# Patient Record
Sex: Female | Born: 1985 | Race: Black or African American | Hispanic: No | Smoking: Never smoker
Health system: Southern US, Community
[De-identification: ages and names within clinical notes are randomized; demographics above are authoritative.]

## PROBLEM LIST (undated history)

## (undated) ENCOUNTER — Inpatient Hospital Stay (HOSPITAL_COMMUNITY): Payer: Self-pay

## (undated) DIAGNOSIS — R51 Headache: Secondary | ICD-10-CM

## (undated) DIAGNOSIS — IMO0002 Reserved for concepts with insufficient information to code with codable children: Secondary | ICD-10-CM

## (undated) DIAGNOSIS — D649 Anemia, unspecified: Secondary | ICD-10-CM

## (undated) DIAGNOSIS — R87629 Unspecified abnormal cytological findings in specimens from vagina: Secondary | ICD-10-CM

## (undated) DIAGNOSIS — R87619 Unspecified abnormal cytological findings in specimens from cervix uteri: Secondary | ICD-10-CM

## (undated) HISTORY — PX: WISDOM TOOTH EXTRACTION: SHX21

## (undated) HISTORY — DX: Anemia, unspecified: D64.9

## (undated) HISTORY — DX: Unspecified abnormal cytological findings in specimens from vagina: R87.629

---

## 2009-02-03 LAB — CONVERTED CEMR LAB: Pap Smear: NORMAL

## 2009-05-31 ENCOUNTER — Ambulatory Visit: Payer: Self-pay | Admitting: Internal Medicine

## 2009-05-31 ENCOUNTER — Telehealth: Payer: Self-pay | Admitting: Internal Medicine

## 2009-05-31 DIAGNOSIS — N912 Amenorrhea, unspecified: Secondary | ICD-10-CM

## 2009-05-31 DIAGNOSIS — O0001 Abdominal pregnancy with intrauterine pregnancy: Secondary | ICD-10-CM | POA: Insufficient documentation

## 2009-05-31 DIAGNOSIS — N898 Other specified noninflammatory disorders of vagina: Secondary | ICD-10-CM | POA: Insufficient documentation

## 2009-05-31 DIAGNOSIS — K219 Gastro-esophageal reflux disease without esophagitis: Secondary | ICD-10-CM

## 2009-05-31 DIAGNOSIS — R519 Headache, unspecified: Secondary | ICD-10-CM | POA: Insufficient documentation

## 2009-05-31 DIAGNOSIS — R51 Headache: Secondary | ICD-10-CM | POA: Insufficient documentation

## 2009-05-31 LAB — CONVERTED CEMR LAB
AST: 19 units/L (ref 0–37)
Albumin: 3.4 g/dL — ABNORMAL LOW (ref 3.5–5.2)
Alkaline Phosphatase: 45 units/L (ref 39–117)
Basophils Absolute: 0 10*3/uL (ref 0.0–0.1)
Beta hcg, urine, semiquantitative: POSITIVE
Bilirubin, Direct: 0.1 mg/dL (ref 0.0–0.3)
CO2: 27 meq/L (ref 19–32)
Calcium: 9.2 mg/dL (ref 8.4–10.5)
Eosinophils Relative: 3.2 % (ref 0.0–5.0)
GFR calc non Af Amer: 95.64 mL/min (ref >60)
Glucose, Bld: 84 mg/dL (ref 70–99)
HDL: 74.3 mg/dL (ref >39.00)
Lymphs Abs: 1.6 10*3/uL (ref 0.7–4.0)
Monocytes Absolute: 0.8 10*3/uL (ref 0.1–1.0)
Monocytes Relative: 13.4 % — ABNORMAL HIGH (ref 3.0–12.0)
Neutrophils Relative %: 56.4 % (ref 43.0–77.0)
Nitrite: NEGATIVE
Platelets: 204 10*3/uL (ref 150.0–400.0)
Potassium: 4.3 meq/L (ref 3.5–5.1)
RDW: 12.8 % (ref 11.5–14.6)
Sodium: 138 meq/L (ref 135–145)
Specific Gravity, Urine: 1.02 (ref 1.000–1.030)
Total CHOL/HDL Ratio: 2
Total Protein, Urine: NEGATIVE mg/dL
Triglycerides: 81 mg/dL (ref 0.0–149.0)
Urine Glucose: NEGATIVE mg/dL
VLDL: 16.2 mg/dL (ref 0.0–40.0)
WBC: 6 10*3/uL (ref 4.5–10.5)
pH: 7 (ref 5.0–8.0)

## 2009-06-01 ENCOUNTER — Encounter: Payer: Self-pay | Admitting: Internal Medicine

## 2009-06-04 ENCOUNTER — Telehealth: Payer: Self-pay | Admitting: Internal Medicine

## 2009-10-16 ENCOUNTER — Inpatient Hospital Stay (HOSPITAL_COMMUNITY): Admission: AD | Admit: 2009-10-16 | Discharge: 2009-10-16 | Payer: Self-pay | Admitting: Obstetrics & Gynecology

## 2009-11-01 ENCOUNTER — Inpatient Hospital Stay (HOSPITAL_COMMUNITY): Admission: AD | Admit: 2009-11-01 | Discharge: 2009-11-03 | Payer: Self-pay | Admitting: Obstetrics and Gynecology

## 2009-11-01 ENCOUNTER — Encounter (INDEPENDENT_AMBULATORY_CARE_PROVIDER_SITE_OTHER): Payer: Self-pay | Admitting: Obstetrics and Gynecology

## 2009-12-17 ENCOUNTER — Ambulatory Visit: Payer: Self-pay | Admitting: Physician Assistant

## 2009-12-17 ENCOUNTER — Inpatient Hospital Stay (HOSPITAL_COMMUNITY): Admission: AD | Admit: 2009-12-17 | Discharge: 2009-12-18 | Payer: Self-pay | Admitting: Obstetrics and Gynecology

## 2010-05-31 NOTE — Progress Notes (Signed)
Summary: pharmacy  Phone Note Outgoing Call   Summary of Call: LA:  Her wet prep was positive, please find out where she wants the antibiotic Rx sent to Initial call taken by: Etta Grandchild MD,  May 31, 2009 12:24 PM  Follow-up for Phone Call        called patient to confirm pharmacy, will [facility] Follow-up by: Rock Nephew CMA,  May 31, 2009 1:26 PM    Prescriptions: METRONIDAZOLE 500 MG TABS (METRONIDAZOLE) One by mouth two times a day for 7 days  #14 x 0   Entered by:   Rock Nephew CMA   Authorized by:   Etta Grandchild MD   Signed by:   Rock Nephew CMA on 05/31/2009   Method used:   [facility]ronically to        [facility] [phone]* (retail)       [address]       [place], [place]  [address]       Ph: [phone] or [phone]       Fax: [phone]   RxID:   [id number]

## 2010-05-31 NOTE — Progress Notes (Signed)
Summary: RX  Phone Note From Pharmacy   Caller: [facility] [phone]* Summary of Call: [facility] that [facility] cannot give [facility] the metronidazole b/c is it out of stock. Per the pharmacy they cannot get it until next month. Called patient to see if she wanted [place] to try a different pharmacy, left message on voicemail to call the office.  Initial call taken by: Lucious Groves,  June 04, 2009 3:16 PM     Appended Document: RX    Phone Note Call from Patient   Summary of Call: Patient returned call stating that metronidazole is on back order system wide for the next month. [facility] to find out if brand name flagyl is available. Per [facility], ok to switch to alternative clyndamycin. Patient and pharmacist notified Initial call taken by: Rock Nephew CMA,  June 04, 2009 5:15 PM    New/Updated Medications: CLINDAMYCIN PHOSPHATE 2 % CREA (CLINDAMYCIN PHOSPHATE) apply one vaginal application at bedtime x 7days Prescriptions: CLINDAMYCIN PHOSPHATE 2 % CREA (CLINDAMYCIN PHOSPHATE) apply one vaginal application at bedtime x 7days  #48ml x 0   Entered by:   Rock Nephew CMA   Authorized by:   Jacques Navy MD   Signed by:   Rock Nephew CMA on 06/04/2009   Method used:   Telephoned to ...       [facility] [place] [phone]* (retail)       [phone]       [place], [place]  [address]       Ph: [phone] or [phone]       Fax: [phone]   RxID:   [id number]

## 2010-05-31 NOTE — Progress Notes (Signed)
     New Problems: ABDOMINAL PREGNANCY WITH INTRAUTERINE PREGNANCY (ICD-633.01)   New Problems: ABDOMINAL PREGNANCY WITH INTRAUTERINE PREGNANCY (ICD-633.01)

## 2010-05-31 NOTE — Assessment & Plan Note (Signed)
Summary: NEW / [facility] / #/ CD   Vital Signs:  Patient profile:   25 year old female Menstrual status:  regular LMP:     02/03/2009 Height:      68 inches Weight:      136 pounds BMI:     20.75 O2 Sat:      100 % on Room air Temp:     98.1 degrees F oral Pulse rate:   77 / minute Pulse rhythm:   regular Resp:     16 per minute BP sitting:   104 / 68  (left arm) Cuff size:   regular  Vitals Entered By: Rock Nephew CMA (May 31, 2009 10:57 AM)  O2 Flow:  Room air  Primary Care Provider:  Etta Grandchild MD   History of Present Illness: New to me she c/o's a 3 month hx. of amenorrhea and nausea but says she uses condoms all the time.  Preventive Screening-Counseling & Management  Alcohol-Tobacco     Alcohol drinks/day: <1     Alcohol type: spirits     >5/day in last 3 mos: no     Alcohol Counseling: not indicated; use of alcohol is not excessive or problematic     Feels need to cut down: no     Feels annoyed by complaints: no     Feels guilty re: drinking: no     Needs 'eye opener' in am: no     Smoking Status: never  Caffeine-Diet-Exercise     Does Patient Exercise: yes  Hep-HIV-STD-Contraception     Hepatitis Risk: no risk noted     HIV Risk: no risk noted     STD Risk: risk noted     STD Risk Counseling: to avoid increased STD risk     Contraception Counseling: questions answered      Sexual History:  currently monogamous.        Drug Use:  no.        Blood Transfusions:  no.    Medications Prior to Update: 1)  None  Current Medications (verified): 1)  None  Allergies (verified): No Known Drug Allergies  Past History:  Past Medical History: GERD Headache, migraine since 21 yoa  Past Surgical History: Denies surgical history  Family History: Family History Hypertension Family History Kidney disease  Social History: Single Never Smoked Alcohol use-yes Drug use-no Regular exercise-yes Smoking Status:  never Hepatitis Risk:   no risk noted HIV Risk:  no risk noted STD Risk:  risk noted Sexual History:  currently monogamous Blood Transfusions:  no Drug Use:  no Does Patient Exercise:  yes  Review of Systems  The patient denies anorexia, fever, weight loss, weight gain, chest pain, peripheral edema, prolonged cough, headaches, hemoptysis, abdominal pain, hematuria, and breast masses.   GU:  Complains of discharge; denies abnormal vaginal bleeding, dysuria, hematuria, incontinence, nocturia, urinary frequency, and urinary hesitancy.  Physical Exam  General:  alert, well-developed, well-nourished, well-hydrated, appropriate dress, normal appearance, healthy-appearing, cooperative to examination, good hygiene, and underweight appearing.   Head:  normocephalic, atraumatic, no abnormalities observed, and no abnormalities palpated.   Eyes:  vision grossly intact, pupils equal, pupils round, and pupils reactive to light.   Ears:  R ear normal and L ear normal.   Mouth:  Oral mucosa and oropharynx without lesions or exudates.  Teeth in good repair. Neck:  No deformities, masses, or tenderness noted. Chest Wall:  No deformities, masses, or tenderness noted. Breasts:  No mass,  nodules, thickening, tenderness, bulging, retraction, inflamation, nipple discharge or skin changes noted.   Lungs:  Normal respiratory effort, chest expands symmetrically. Lungs are clear to auscultation, no crackles or wheezes. Heart:  Normal rate and regular rhythm. S1 and S2 normal without gallop, murmur, click, rub or other extra sounds. Abdomen:  soft, non-tender, normal bowel sounds, no distention, no masses, no guarding, no rigidity, no rebound tenderness, and no hepatomegaly.   Genitalia:  uterus is enlarged and palpable c/w 1st trimester, there is a scant amount of curdled exudate, normal introitus, no external lesions, mucosa pink and moist, no vaginal or cervical lesions, no friaility or hemorrhage, and no adnexal masses or tenderness.     Msk:  normal ROM, no joint tenderness, no joint swelling, no joint warmth, no redness over joints, no joint deformities, no joint instability, and no crepitation.   Pulses:  R and L carotid,radial,femoral,dorsalis pedis and posterior tibial pulses are full and equal bilaterally Extremities:  No clubbing, cyanosis, edema, or deformity noted with normal full range of motion of all joints.   Neurologic:  No cranial nerve deficits noted. Station and gait are normal. Plantar reflexes are down-going bilaterally. DTRs are symmetrical throughout. Sensory, motor and coordinative functions appear intact. Skin:  Intact without suspicious lesions or rashes Cervical Nodes:  no anterior cervical adenopathy and no posterior cervical adenopathy.   Axillary Nodes:  no R axillary adenopathy and no L axillary adenopathy.   Inguinal Nodes:  no R inguinal adenopathy and no L inguinal adenopathy.   Psych:  Cognition and judgment appear intact. Alert and cooperative with normal attention span and concentration. No apparent delusions, illusions, hallucinations   Impression & Recommendations:  Problem # 1:  AMENORRHEA (ICD-626.0) Assessment New  Orders: Venipuncture (09323) TLB-Lipid Panel (80061-LIPID) TLB-CBC Platelet - w/Differential (85025-CBCD) TLB-BMP (Basic Metabolic Panel-BMET) (80048-METABOL) TLB-Hepatic/Liver Function Pnl (80076-HEPATIC) TLB-TSH (Thyroid Stimulating Hormone) (84443-TSH) TLB-Preg Serum Quant (B-hCG) (84702-HCG-QN) TLB-Udip w/ Micro (81001-URINE) T-Chlamydia Probe, genital (55732-20254) T-GC Probe, genital (27062-37628) Urine Pregnancy Test  (31517)  Problem # 2:  VAGINAL DISCHARGE (ICD-623.5) Assessment: New wet prep positive for clue cells so will start metronidazole Orders: Urine Pregnancy Test  (61607) TLB-Wet [place] / Fungus (87210-WPREP)  Problem # 3:  ROUTINE GENERAL MEDICAL EXAM@HEALTH  CARE FACL (ICD-V70.0) Assessment: New  Orders: Venipuncture (37106) TLB-Lipid  Panel (80061-LIPID) TLB-CBC Platelet - w/Differential (85025-CBCD) TLB-BMP (Basic Metabolic Panel-BMET) (80048-METABOL) TLB-Hepatic/Liver Function Pnl (80076-HEPATIC) TLB-TSH (Thyroid Stimulating Hormone) (84443-TSH) TLB-Preg Serum Quant (B-hCG) (84702-HCG-QN) TLB-Udip w/ Micro (81001-URINE) T-Chlamydia Probe, genital (26948-54627) T-GC Probe, genital (03500-93818) Urine Pregnancy Test  (29937)  Complete Medication List: 1)  Metronidazole 500 Mg Tabs (Metronidazole) .... One by mouth two times a day for 7 days  PAP Screening:    Last PAP smear:  02/03/2009  Osteoporosis Risk Assessment:  Risk Factors for Fracture or Low Bone Density:   Smoking status:       never  Patient Instructions: 1)  Please schedule a follow-up appointment in 1 month. 2)  If you could be exposed to sexually transmitted diseases, you should use a condom. Prescriptions: METRONIDAZOLE 500 MG TABS (METRONIDAZOLE) One by mouth two times a day for 7 days  #14 x 0   Entered and Authorized by:   Etta Grandchild MD   Signed by:   Etta Grandchild MD on 05/31/2009   Method used:   Historical   RxID:   [id number]   Preventive Care Screening  Pap Smear:    Date:  02/03/2009    Results:  normal      Laboratory Results   Urine Tests      Urine HCG: positive

## 2010-05-31 NOTE — Letter (Signed)
Summary: Results Follow-up Letter  [facility]  [address] [place], [place] [address]   Phone: [phone]  Fax: [phone]    06/01/2009  [address] [place], [place]  [address]  Dear Ms. Bosak,   The following are the results of your recent test(s):  Test     Result     CBC       mild anemia Urine       trace evidence of infection Liver/kidney   normal Preg. test     very high Vaginal specimen   positive for bacteria  _________________________________________________________  Please call for an appointment in 2-3 weeks _________________________________________________________ _________________________________________________________ _________________________________________________________  Sincerely,  Sanda Linger MD [facility]

## 2010-07-17 LAB — CBC
HCT: 35.5 % — ABNORMAL LOW (ref 36.0–46.0)
Hemoglobin: 10.9 g/dL — ABNORMAL LOW (ref 12.0–15.0)
MCH: 27.7 pg (ref 26.0–34.0)
MCH: 28.4 pg (ref 26.0–34.0)
MCHC: 33.3 g/dL (ref 30.0–36.0)
MCV: 83.3 fL (ref 78.0–100.0)
MCV: 84.9 fL (ref 78.0–100.0)
Platelets: 237 10*3/uL (ref 150–400)
RBC: 3.85 MIL/uL — ABNORMAL LOW (ref 3.87–5.11)
RDW: 18.8 % — ABNORMAL HIGH (ref 11.5–15.5)
WBC: 7.9 10*3/uL (ref 4.0–10.5)

## 2011-05-01 ENCOUNTER — Emergency Department (HOSPITAL_COMMUNITY)
Admission: EM | Admit: 2011-05-01 | Discharge: 2011-05-01 | Disposition: A | Discharge status: Home / Self Care | Payer: Medicaid Other | Attending: Emergency Medicine | Admitting: Emergency Medicine

## 2011-05-01 ENCOUNTER — Encounter: Payer: Self-pay | Admitting: *Deleted

## 2011-05-01 DIAGNOSIS — R070 Pain in throat: Secondary | ICD-10-CM | POA: Insufficient documentation

## 2011-05-01 DIAGNOSIS — B349 Viral infection, unspecified: Secondary | ICD-10-CM

## 2011-05-01 DIAGNOSIS — R05 Cough: Secondary | ICD-10-CM | POA: Insufficient documentation

## 2011-05-01 DIAGNOSIS — J069 Acute upper respiratory infection, unspecified: Secondary | ICD-10-CM | POA: Insufficient documentation

## 2011-05-01 DIAGNOSIS — R059 Cough, unspecified: Secondary | ICD-10-CM | POA: Insufficient documentation

## 2011-05-01 DIAGNOSIS — H9209 Otalgia, unspecified ear: Secondary | ICD-10-CM | POA: Insufficient documentation

## 2011-05-01 DIAGNOSIS — B9789 Other viral agents as the cause of diseases classified elsewhere: Secondary | ICD-10-CM | POA: Insufficient documentation

## 2011-05-01 DIAGNOSIS — J3489 Other specified disorders of nose and nasal sinuses: Secondary | ICD-10-CM | POA: Insufficient documentation

## 2011-05-01 LAB — RAPID STREP SCREEN (MED CTR MEBANE ONLY): Streptococcus, Group A Screen (Direct): NEGATIVE

## 2011-05-01 MED ORDER — IBUPROFEN 800 MG PO TABS
800.0000 mg | ORAL_TABLET | Freq: Once | ORAL | Status: AC
  Administered 2011-05-01: 800 mg via ORAL
  Filled 2011-05-01: qty 1

## 2011-05-01 NOTE — ED Notes (Signed)
Pt c/o bilateral otalgia, and tonsillar swelling.  Pt also experiencing coughing.  Pt denies N/V/D or fever.

## 2011-05-01 NOTE — ED Provider Notes (Signed)
History     CSN: [id number]  Arrival date & time 05/01/11  3086   First MD Initiated Contact with Patient 05/01/11 [phone]      Chief Complaint  Patient presents with  . Otalgia    HPI  History provided by the patient. Patient is 25 year old female with no significant past medical history whose come to the emergency room to have her 69-year-old son evaluated for cough cold and fever who now also wants to be checked for complaints of sore throat and bilateral ear pain for the past one to 2 days. He reports symptoms came on acutely and have been gradually increasing. She also reports developing a slight cough at times. She denies having any nausea vomiting or diarrhea. She denies feeling fever, chills, sweats. She has not taken anything for her symptoms.    History reviewed. No pertinent past medical history.  History reviewed. No pertinent past surgical history.  History reviewed. No pertinent family history.  History  Substance Use Topics  . Smoking status: Not on file  . Smokeless tobacco: Not on file  . Alcohol Use: Not on file    OB History    Grav Para Term Preterm Abortions TAB SAB Ect Mult Living                  Review of Systems  Constitutional: Negative for fever and chills.  HENT: Positive for ear pain, sore throat and rhinorrhea. Negative for hearing loss and congestion.   Respiratory: Positive for cough. Negative for shortness of breath.   Cardiovascular: Negative for chest pain.  Gastrointestinal: Negative for nausea, vomiting and diarrhea.  All other systems reviewed and are negative.    Allergies  Review of patient's allergies indicates no known allergies.  Home Medications  No current outpatient prescriptions on file.  BP 129/70  Pulse 75  Temp(Src) 97.8 F (36.6 C) (Oral)  Resp 20  SpO2 100%  LMP 05/01/2011  Physical Exam  Nursing note and vitals reviewed. Constitutional: She is oriented to person, place, and time. She appears  well-developed and well-nourished. No distress.  HENT:  Head: Normocephalic.       Erythema to bilateral TMs. Mild erythema of oropharynx. Tonsils normal size without exudate.  Eyes: Conjunctivae and EOM are normal. Pupils are equal, round, and reactive to light.  Neck: Normal range of motion. Neck supple.  Cardiovascular: Normal rate and regular rhythm.   Pulmonary/Chest: Effort normal and breath sounds normal. No respiratory distress. She has no wheezes. She has no rales.  Abdominal: Soft. Bowel sounds are normal. She exhibits no distension. There is no tenderness.  Lymphadenopathy:    She has no cervical adenopathy.  Neurological: She is alert and oriented to person, place, and time.  Skin: Skin is warm and dry. No rash noted.  Psychiatric: She has a normal mood and affect. Her behavior is normal.    ED Course  Procedures (including critical care time)   Labs Reviewed  RAPID STREP SCREEN   Results for orders placed during the hospital encounter of 05/01/11  RAPID STREP SCREEN      Component Value Range   Streptococcus, Group A Screen (Direct) NEGATIVE  NEGATIVE      1. Viral infection   2. URI (upper respiratory infection)   3. Otalgia       MDM  5:00AM she seen and evaluated. Patient in no acute distress.        Phill Mutter [place], [place] 05/01/11 [phone]  Medical screening examination/treatment/procedure(s)  were performed by non-physician practitioner and as supervising physician I was immediately available for consultation/collaboration.   Sunnie Nielsen, MD 05/01/11 [phone]

## 2011-05-01 NOTE — ED Notes (Signed)
Pt presents with throat and ear pain x 3 days.

## 2012-02-18 ENCOUNTER — Encounter (HOSPITAL_COMMUNITY): Payer: Self-pay | Admitting: *Deleted

## 2012-02-18 ENCOUNTER — Inpatient Hospital Stay (HOSPITAL_COMMUNITY): Payer: Medicaid Other

## 2012-02-18 ENCOUNTER — Inpatient Hospital Stay (HOSPITAL_COMMUNITY)
Admission: AD | Admit: 2012-02-18 | Discharge: 2012-02-18 | Disposition: A | Discharge status: Home / Self Care | Payer: Medicaid Other | Source: Ambulatory Visit | Attending: Family Medicine | Admitting: Family Medicine

## 2012-02-18 DIAGNOSIS — O200 Threatened abortion: Secondary | ICD-10-CM | POA: Insufficient documentation

## 2012-02-18 DIAGNOSIS — O4690 Antepartum hemorrhage, unspecified, unspecified trimester: Secondary | ICD-10-CM

## 2012-02-18 HISTORY — DX: Headache: R51

## 2012-02-18 HISTORY — DX: Unspecified abnormal cytological findings in specimens from cervix uteri: R87.619

## 2012-02-18 HISTORY — DX: Reserved for concepts with insufficient information to code with codable children: IMO0002

## 2012-02-18 LAB — CBC
MCHC: 32.2 g/dL (ref 30.0–36.0)
MCV: 83.7 fL (ref 78.0–100.0)
Platelets: 268 10*3/uL (ref 150–400)
RDW: 13.1 % (ref 11.5–15.5)
WBC: 4.8 10*3/uL (ref 4.0–10.5)

## 2012-02-18 LAB — WET PREP, GENITAL
Trich, Wet Prep: NONE SEEN
Yeast Wet Prep HPF POC: NONE SEEN

## 2012-02-18 LAB — POCT PREGNANCY, URINE: Preg Test, Ur: POSITIVE — AB

## 2012-02-18 NOTE — MAU Note (Signed)
Period is Sept was short and light.  Pos HPT today and 3 days ago.

## 2012-02-18 NOTE — MAU Provider Note (Signed)
  History     CSN: [id number]  Arrival date and time: 02/18/12 1340   First Provider Initiated Contact with Patient 02/18/12 1422      Chief Complaint  Patient presents with  . Threatened Miscarriage   HPI Amber Newton 26 y.o. Comes to MAU with vaginal bleeding and cramping.  Had positive home pregnancy test. LMP 01-10-12.   Has not had any prenatal care.  Gives verbal history of heavier bleeding and cramping.  Then she passed some clots and the cramping has stopped.  Bleeding is much smaller amount now but is continuing.  Is worried about a miscarriage.  OB History    Grav Para Term Preterm Abortions TAB SAB Ect Mult Living   1 1 1  0 0 0 0 0 0 1      Past Medical History  Diagnosis Date  . Abnormal Pap smear     HPV, colpo  . Infection     UTI  . Headache     Past Surgical History  Procedure Date  . No past surgeries     Family History  Problem Relation Age of Onset  . Family history unknown: Yes    History  Substance Use Topics  . Smoking status: Former Games developer  . Smokeless tobacco: Never Used  . Alcohol Use: No    Allergies: No Known Allergies  Prescriptions prior to admission  Medication Sig Dispense Refill  . ibuprofen (ADVIL,MOTRIN) 200 MG tablet Take 200 mg by mouth every 6 (six) hours as needed. pain        ROS Physical Exam   Blood pressure 125/80, pulse 70, temperature 98.6 F (37 C), temperature source Oral, resp. rate 18, last menstrual period 01/10/2012, SpO2 99.00%.  Physical Exam  Nursing note and vitals reviewed. Constitutional: She is oriented to person, place, and time. She appears well-developed and well-nourished.  HENT:  Head: Normocephalic.  Eyes: EOM are normal.  Neck: Neck supple.  GI: Soft. There is no tenderness.  Genitourinary:       Speculum exam - Moderate dark vaginal bleeding with clotting.  Musculoskeletal: Normal range of motion.  Neurological: She is alert and oriented to person, place, and time.  Skin: Skin is  warm and dry.  Psychiatric: She has a normal mood and affect.    MAU Course  Procedures  MDM Clinical Data: Vaginal bleeding  OBSTETRIC <14 WK ULTRASOUND, TRANSVAGINAL OB US, AND FETAL NUCHAL  TRANSLUCENCY [place]  Technique: Transabdominal and transvaginal ultrasound was  performed for evaluation of the gestation as well as the maternal  uterus and adnexal regions. Fetal nuchal translucency measurement  was also obtained.  Findings:  No intrauterine gestational sac.  Maternal uterus/adnexae: Within the midportion of the endometrium  there is a focal area of thickening measuring 1.25 cm. Mobile blood  is identified within the endometrial cavity and there are areas of  increased vascularity.  Both ovaries appear normal.  No free fluid within the pelvis.  IMPRESSION:  1. No evidence for intrauterine pregnancy.  2. Endometrial thickening and mobile blood within the endometrial  cavity may reflect sequela of failed pregnancy.   Blood Type A positive  Assessment and Plan  Bleeding in pregnancy  Plan Discussed at length ectopic pregnancy vs miscarriage.  Ectopic precautions reviewed. Will repeat quant on Wed am to verify miscarriage. Advised to return sooner is having worsening pain.  BURLESON,TERRI 02/18/2012, 2:30 PM

## 2012-02-18 NOTE — MAU Note (Signed)
Bleeding started 1230, was cramping really bad, blood when wiped, blood in toilet, passed a clot.

## 2012-02-19 LAB — GC/CHLAMYDIA PROBE AMP, GENITAL: Chlamydia, DNA Probe: NEGATIVE

## 2012-02-19 NOTE — MAU Provider Note (Signed)
Chart reviewed and agree with management and plan.  

## 2012-02-21 ENCOUNTER — Inpatient Hospital Stay (HOSPITAL_COMMUNITY)
Admission: AD | Admit: 2012-02-21 | Discharge: 2012-02-21 | Disposition: A | Discharge status: Home / Self Care | Payer: Medicaid Other | Source: Ambulatory Visit | Attending: Obstetrics & Gynecology | Admitting: Obstetrics & Gynecology

## 2012-02-21 DIAGNOSIS — O039 Complete or unspecified spontaneous abortion without complication: Secondary | ICD-10-CM | POA: Insufficient documentation

## 2012-02-21 NOTE — MAU Note (Signed)
Pt here for F/U BHCG, continues to have abd & back pain, also bleeding.

## 2012-02-21 NOTE — MAU Provider Note (Signed)
  History     CSN: [id number]  Arrival date and time: 02/21/12 1120   None     Chief Complaint  Patient presents with  . Follow-up   HPI THis is a 26 y.o. female at [redacted]w[redacted]d who presents for followup Quant HCG.  She was seen 3 days ago following an episode of heavy bleeding with passage of a large clump of tissue at home. She returns today to verify progression of SAB.  OB History    Grav Para Term Preterm Abortions TAB SAB Ect Mult Living   2 1 1  0 0 0 0 0 0 1      Past Medical History  Diagnosis Date  . Abnormal Pap smear     HPV, colpo  . Infection     UTI  . Headache     Past Surgical History  Procedure Date  . No past surgeries     Family History  Problem Relation Age of Onset  . Family history unknown: Yes    History  Substance Use Topics  . Smoking status: Former Games developer  . Smokeless tobacco: Never Used  . Alcohol Use: No    Allergies: No Known Allergies  Prescriptions prior to admission  Medication Sig Dispense Refill  . ibuprofen (ADVIL,MOTRIN) 200 MG tablet Take 200 mg by mouth every 6 (six) hours as needed. pain        ROS See HPI Denies significant pain, does have small amt bleeding  Physical Exam   Blood pressure 112/67, pulse 64, temperature 98 F (36.7 C), temperature source Oral, resp. rate 18, last menstrual period 01/10/2012.  Physical Exam  Constitutional: She is oriented to person, place, and time. She appears well-developed and well-nourished. No distress.  Cardiovascular: Normal rate.   Respiratory: Effort normal.  Musculoskeletal: Normal range of motion.  Neurological: She is alert and oriented to person, place, and time.  Skin: Skin is warm and dry.  Psychiatric: She has a normal mood and affect.   Pelvic exam not indicated  MAU Course  Procedures  MDM Results for orders placed during the hospital encounter of 02/21/12 (from the past 24 hour(s))  HCG, QUANTITATIVE, PREGNANCY     Status: Abnormal   Collection Time   02/21/12 11:50 AM      Component Value Range   hCG, Beta Chain, Quant, S 257 (*) <5 mIU/mL   ** Previous Quant HCG was 2441 on 02/18/12  Assessment and Plan  A:  Pregnancy, probably complete SAB      Significant fall in Quantitative HCG level  P:  Discussed with patient      Will follow weekly quants in Clinic      Will schedule f/u visit in Clinic in 4 wks.  Does not want to get pregnant again, will discuss contraception then. Pelvic rest while bleeding.   Wynelle Bourgeois 02/21/2012, 1:17 PM

## 2012-02-21 NOTE — MAU Note (Signed)
MWilliams, CNM notified of pt's labs back, will review

## 2012-03-01 ENCOUNTER — Other Ambulatory Visit: Payer: Medicaid Other

## 2012-03-01 NOTE — MAU Provider Note (Signed)
Medical Screening exam and patient care preformed by advanced practice provider.  Agree with the above management.  

## 2012-03-22 ENCOUNTER — Ambulatory Visit: Payer: Medicaid Other | Admitting: Advanced Practice Midwife

## 2012-03-25 ENCOUNTER — Ambulatory Visit: Payer: Medicaid Other | Admitting: Obstetrics and Gynecology

## 2012-12-23 ENCOUNTER — Encounter (HOSPITAL_COMMUNITY): Payer: Self-pay | Admitting: *Deleted

## 2013-01-31 ENCOUNTER — Encounter (HOSPITAL_COMMUNITY): Payer: Self-pay | Admitting: *Deleted

## 2013-01-31 ENCOUNTER — Inpatient Hospital Stay (HOSPITAL_COMMUNITY)
Admission: AD | Admit: 2013-01-31 | Discharge: 2013-01-31 | Disposition: A | Discharge status: Home / Self Care | Payer: Medicaid Other | Source: Ambulatory Visit | Attending: Obstetrics and Gynecology | Admitting: Obstetrics and Gynecology

## 2013-01-31 ENCOUNTER — Inpatient Hospital Stay (HOSPITAL_COMMUNITY): Payer: Medicaid Other

## 2013-01-31 DIAGNOSIS — O26899 Other specified pregnancy related conditions, unspecified trimester: Secondary | ICD-10-CM

## 2013-01-31 DIAGNOSIS — R350 Frequency of micturition: Secondary | ICD-10-CM | POA: Insufficient documentation

## 2013-01-31 DIAGNOSIS — O9989 Other specified diseases and conditions complicating pregnancy, childbirth and the puerperium: Secondary | ICD-10-CM

## 2013-01-31 DIAGNOSIS — O99891 Other specified diseases and conditions complicating pregnancy: Secondary | ICD-10-CM | POA: Insufficient documentation

## 2013-01-31 DIAGNOSIS — R109 Unspecified abdominal pain: Secondary | ICD-10-CM

## 2013-01-31 LAB — WET PREP, GENITAL: Yeast Wet Prep HPF POC: NONE SEEN

## 2013-01-31 LAB — CBC
HCT: 36.5 % (ref 36.0–46.0)
MCH: 27.4 pg (ref 26.0–34.0)
MCV: 82.6 fL (ref 78.0–100.0)
Platelets: 285 10*3/uL (ref 150–400)
RBC: 4.42 MIL/uL (ref 3.87–5.11)
RDW: 13.1 % (ref 11.5–15.5)
WBC: 6.4 10*3/uL (ref 4.0–10.5)

## 2013-01-31 LAB — URINALYSIS, ROUTINE W REFLEX MICROSCOPIC
Bilirubin Urine: NEGATIVE
Glucose, UA: NEGATIVE mg/dL
Hgb urine dipstick: NEGATIVE
Specific Gravity, Urine: 1.015 (ref 1.005–1.030)
Urobilinogen, UA: 1 mg/dL (ref 0.0–1.0)

## 2013-01-31 LAB — POCT PREGNANCY, URINE: Preg Test, Ur: POSITIVE — AB

## 2013-01-31 MED ORDER — PHENAZOPYRIDINE HCL 100 MG PO TABS
200.0000 mg | ORAL_TABLET | Freq: Once | ORAL | Status: AC
  Administered 2013-01-31: 200 mg via ORAL
  Filled 2013-01-31 (×2): qty 1

## 2013-01-31 MED ORDER — PHENAZOPYRIDINE HCL 200 MG PO TABS: 200.0000 mg | ORAL_TABLET | Freq: Three times a day (TID) | ORAL | Status: DC

## 2013-01-31 NOTE — MAU Note (Signed)
Patient presents with complaint of lower abdominal cramping x 2 days. States she has a UTI and is taking macrobid.

## 2013-01-31 NOTE — MAU Provider Note (Signed)
Chief Complaint: Abdominal Pain   First Provider Initiated Contact with Patient 01/31/13 1507     SUBJECTIVE HPI: Amber Newton is a 27 y.o. G3P1011 at 4.3 by LMP who presents with low abd cramping that feels similar when she miscarried last year. Pos home UPT. No other testing this pregnancy. Called office two days ago because of urinary frequency and dysuria. Called in Rx Macrobid. Has taken 2 doses. No relief of Sx.   Denies fever, chills, flank pain, VB, GI complaints. Last BM today. Has had increase white discharge w/out odor or itching.   Past Medical History  Diagnosis Date  . Abnormal Pap smear     HPV, colpo  . Infection     UTI  . Headache(784.0)    OB History  Gravida Para Term Preterm AB SAB TAB Ectopic Multiple Living  4 1 1  0 1 1 0 0 0 1    # Outcome Date GA Lbr Len/2nd Weight Sex Delivery Anes PTL Lv  4 CUR           3 SAB           2 TRM           1 GRA              Comments: System Generated. Please review and update pregnancy details.     Past Surgical History  Procedure Laterality Date  . No past surgeries     History   Social History  . Marital Status: Single    Spouse Name: N/A    Number of Children: N/A  . Years of Education: N/A   Occupational History  . Not on file.   Social History Main Topics  . Smoking status: Former Games developer  . Smokeless tobacco: Never Used  . Alcohol Use: No  . Drug Use: No  . Sexual Activity: Yes   Other Topics Concern  . Not on file   Social History Narrative  . No narrative on file   No current facility-administered medications on file prior to encounter.   No current outpatient prescriptions on file prior to encounter.   No Known Allergies  ROS: Pertinent items in HPI.  OBJECTIVE Blood pressure 113/47, pulse 89, temperature 98.7 F (37.1 C), temperature source Oral, resp. rate 18, height 5\' 8"  (1.727 m), weight 74.844 kg (165 lb), last menstrual period 12/31/2012, not currently breastfeeding. GENERAL:  Well-developed, well-nourished female in no acute distress.  HEENT: Normocephalic HEART: normal rate RESP: normal effort ABDOMEN: Soft, mile SP tenderness. Neg rebound, mass or guarding. Pos BS.  EXTREMITIES: Nontender, no edema NEURO: Alert and oriented SPECULUM EXAM: NEFG, large amount of thick, white, odorless discharge, no blood noted, cervix clean BIMANUAL: cervix closed; uterus normal size, no adnexal tenderness or masses. No CMT.   LAB RESULTS Results for orders placed during the hospital encounter of 01/31/13 (from the past 24 hour(s))  URINALYSIS, ROUTINE W REFLEX MICROSCOPIC     Status: Abnormal   Collection Time    01/31/13  2:30 PM      Result Value Range   Color, Urine YELLOW  YELLOW   APPearance CLEAR  CLEAR   Specific Gravity, Urine 1.015  1.005 - 1.030   pH 8.5 (*) 5.0 - 8.0   Glucose, UA NEGATIVE  NEGATIVE mg/dL   Hgb urine dipstick NEGATIVE  NEGATIVE   Bilirubin Urine NEGATIVE  NEGATIVE   Ketones, ur NEGATIVE  NEGATIVE mg/dL   Protein, ur NEGATIVE  NEGATIVE mg/dL  Urobilinogen, UA 1.0  0.0 - 1.0 mg/dL   Nitrite NEGATIVE  NEGATIVE   Leukocytes, UA NEGATIVE  NEGATIVE  POCT PREGNANCY, URINE     Status: Abnormal   Collection Time    01/31/13  3:04 PM      Result Value Range   Preg Test, Ur POSITIVE (*) NEGATIVE  HCG, QUANTITATIVE, PREGNANCY     Status: Abnormal   Collection Time    01/31/13  3:25 PM      Result Value Range   hCG, Beta Chain, Quant, S 1897 (*) <5 mIU/mL  CBC     Status: None   Collection Time    01/31/13  3:25 PM      Result Value Range   WBC 6.4  4.0 - 10.5 K/uL   RBC 4.42  3.87 - 5.11 MIL/uL   Hemoglobin 12.1  12.0 - 15.0 g/dL   HCT 21.3  08.6 - 57.8 %   MCV 82.6  78.0 - 100.0 fL   MCH 27.4  26.0 - 34.0 pg   MCHC 33.2  30.0 - 36.0 g/dL   RDW 46.9  62.9 - 52.8 %   Platelets 285  150 - 400 K/uL    IMAGING No results found.  MAU COURSE  ASSESSMENT 1. Abdominal pain complicating pregnancy, antepartum   2. Urinary frequency      PLAN Discharge home per consult w/ [place].  Ectopic and SAB precautions.      Follow-up Information   Follow up with [facility] [place] [facility] In 2 days. (or sooner  as needed if symptoms worsen)    Contact information:   [address] [id number] [place] [address] [phone]       Medication List         nitrofurantoin (macrocrystal-monohydrate) 100 MG capsule  Commonly known as:  MACROBID  Take 100 mg by mouth 2 (two) times daily.     phenazopyridine 200 MG tablet  Commonly known as:  PYRIDIUM  Take 1 tablet (200 mg total) by mouth 3 (three) times daily.        Byers, CNM 01/31/2013  6:11 PM

## 2013-01-31 NOTE — MAU Note (Signed)
Pt states here for lower abdominal pain. Feels like menstrual cycle is coming on, however she is pregnant. Prior miscarriage before, concerned the pregnancy is okay. Denies abnormal discharge or bleeding. Taking macrobid for uti.

## 2013-02-01 LAB — CULTURE, OB URINE
Colony Count: 55000
Special Requests: NORMAL

## 2013-02-02 ENCOUNTER — Inpatient Hospital Stay (HOSPITAL_COMMUNITY)
Admission: AD | Admit: 2013-02-02 | Discharge: 2013-02-02 | Disposition: A | Discharge status: Home / Self Care | Payer: Medicaid Other | Source: Ambulatory Visit | Attending: Obstetrics and Gynecology | Admitting: Obstetrics and Gynecology

## 2013-02-02 DIAGNOSIS — N390 Urinary tract infection, site not specified: Secondary | ICD-10-CM | POA: Insufficient documentation

## 2013-02-02 DIAGNOSIS — O2341 Unspecified infection of urinary tract in pregnancy, first trimester: Secondary | ICD-10-CM

## 2013-02-02 DIAGNOSIS — O2390 Unspecified genitourinary tract infection in pregnancy, unspecified trimester: Secondary | ICD-10-CM | POA: Insufficient documentation

## 2013-02-02 NOTE — MAU Provider Note (Signed)
Amber Newton is a 27 y.o. G4P1011 at [redacted]w[redacted]d here for follow up quant HCG. Vaginal bleeding: none. Abdominal pain: none.   Prior HCGs: 10/3: 1800  Prior U/S: 10/3: 4 week IUGS, no YS or fetal pole  Past Medical History  Diagnosis Date  . Abnormal Pap smear     HPV, colpo  . Infection     UTI  . Headache(784.0)     Prescriptions prior to admission  Medication Sig Dispense Refill  . nitrofurantoin, macrocrystal-monohydrate, (MACROBID) 100 MG capsule Take 100 mg by mouth 2 (two) times daily.      . phenazopyridine (PYRIDIUM) 200 MG tablet Take 1 tablet (200 mg total) by mouth 3 (three) times daily.  9 tablet  0    No Known Allergies  Review of Systems - Negative except mild cramping  Objective BP 114/71  Pulse 82  Temp(Src) 98.2 F (36.8 C) (Oral)  Resp 16  LMP 12/31/2012  General: Alert, oriented, no acute distress  Results for orders placed during the hospital encounter of 02/02/13 (from the past 24 hour(s))  HCG, QUANTITATIVE, PREGNANCY     Status: Abnormal   Collection Time    02/02/13  5:15 PM      Result Value Range   hCG, Beta Chain, Quant, S 4082 (*) <5 mIU/mL    Assessment  1. UTI in pregnancy, antepartum, first trimester     Plan  F/u for repeat u/s in office in 1 week, precautions rev'd    Medication List         nitrofurantoin (macrocrystal-monohydrate) 100 MG capsule  Commonly known as:  MACROBID  Take 100 mg by mouth 2 (two) times daily.     phenazopyridine 200 MG tablet  Commonly known as:  PYRIDIUM  Take 1 tablet (200 mg total) by mouth 3 (three) times daily.            Follow-up Information   Follow up with [facility],JAMES E, MD. Schedule an appointment as soon as possible for a visit in 1 week. (for ultrasound in office)    Specialty:  Obstetrics and Gynecology   Contact information:   [address] [place] [address] [phone]           Medication List    ASK your doctor about these medications     nitrofurantoin (macrocrystal-monohydrate) 100 MG capsule  Commonly known as:  MACROBID  Take 100 mg by mouth 2 (two) times daily.     phenazopyridine 200 MG tablet  Commonly known as:  PYRIDIUM  Take 1 tablet (200 mg total) by mouth 3 (three) times daily.          Bernadine Melecio 5:49 PM 02/02/13

## 2013-02-02 NOTE — MAU Note (Signed)
Pt presents for repeat labs for quant

## 2013-02-27 LAB — OB RESULTS CONSOLE RPR: RPR: NONREACTIVE

## 2013-02-27 LAB — OB RESULTS CONSOLE RUBELLA ANTIBODY, IGM: RUBELLA: IMMUNE

## 2013-02-27 LAB — OB RESULTS CONSOLE HEPATITIS B SURFACE ANTIGEN: Hepatitis B Surface Ag: NEGATIVE

## 2013-02-27 LAB — OB RESULTS CONSOLE ANTIBODY SCREEN: Antibody Screen: NEGATIVE

## 2013-02-27 LAB — OB RESULTS CONSOLE HIV ANTIBODY (ROUTINE TESTING): HIV: NONREACTIVE

## 2013-03-14 ENCOUNTER — Inpatient Hospital Stay (HOSPITAL_COMMUNITY)
Admission: AD | Admit: 2013-03-14 | Discharge: 2013-03-14 | Disposition: A | Discharge status: Home / Self Care | Payer: Medicaid Other | Source: Ambulatory Visit | Attending: Obstetrics & Gynecology | Admitting: Obstetrics & Gynecology

## 2013-03-14 ENCOUNTER — Encounter (HOSPITAL_COMMUNITY): Payer: Self-pay | Admitting: *Deleted

## 2013-03-14 DIAGNOSIS — O99891 Other specified diseases and conditions complicating pregnancy: Secondary | ICD-10-CM | POA: Insufficient documentation

## 2013-03-14 DIAGNOSIS — R109 Unspecified abdominal pain: Secondary | ICD-10-CM

## 2013-03-14 DIAGNOSIS — O26899 Other specified pregnancy related conditions, unspecified trimester: Secondary | ICD-10-CM

## 2013-03-14 DIAGNOSIS — O9989 Other specified diseases and conditions complicating pregnancy, childbirth and the puerperium: Secondary | ICD-10-CM

## 2013-03-14 LAB — URINE MICROSCOPIC-ADD ON

## 2013-03-14 LAB — WET PREP, GENITAL
Clue Cells Wet Prep HPF POC: NONE SEEN
Trich, Wet Prep: NONE SEEN
Yeast Wet Prep HPF POC: NONE SEEN

## 2013-03-14 LAB — URINALYSIS, ROUTINE W REFLEX MICROSCOPIC
Bilirubin Urine: NEGATIVE
Glucose, UA: NEGATIVE mg/dL
Ketones, ur: NEGATIVE mg/dL
Protein, ur: NEGATIVE mg/dL
Urobilinogen, UA: 0.2 mg/dL (ref 0.0–1.0)

## 2013-03-14 NOTE — MAU Note (Signed)
Abdominal cramping x1 week. Pain worst tonight and when urinating or having a bowel movement. Denies VB. A lot of clear vaginal discharge, no irritation or foul odor.

## 2013-03-14 NOTE — MAU Provider Note (Signed)
History     CSN: [id number]  Arrival date and time: 03/14/13 2159   First Provider Initiated Contact with Patient 03/14/13 2223      Chief Complaint  Patient presents with  . Abdominal Cramping   HPI Ms. Amber Newton is a 27 y.o. G4P1011 at [redacted]w[redacted]d who presents to MAU today with complaint of lower abdominal cramping x 1 week. She states that pain is worse with urination and BM. She describes the pain as cramping with some sharp pains. She is having a clear, mucus discharge. She denies vaginal bleeding, LOF, fever or UTI symptoms.   OB History   Grav Para Term Preterm Abortions TAB SAB Ect Mult Living   4 1 1  0 1 0 1 0 0 1      Past Medical History  Diagnosis Date  . Abnormal Pap smear     HPV, colpo  . Infection     UTI  . OZHYQMVH(846.9)     Past Surgical History  Procedure Laterality Date  . No past surgeries      Family History  Problem Relation Age of Onset  . Hypertension Mother   . Cancer Father   . Cancer Maternal Aunt   . Hypertension Maternal Grandmother   . Diabetes Maternal Grandmother   . Diabetes Paternal Grandmother   . Cancer Paternal Grandfather     History  Substance Use Topics  . Smoking status: Former Games developer  . Smokeless tobacco: Never Used  . Alcohol Use: No    Allergies: No Known Allergies  Prescriptions prior to admission  Medication Sig Dispense Refill  . nitrofurantoin, macrocrystal-monohydrate, (MACROBID) 100 MG capsule Take 100 mg by mouth 2 (two) times daily.      . ondansetron (ZOFRAN-ODT) 4 MG disintegrating tablet Take 4 mg by mouth every 8 (eight) hours as needed for nausea or vomiting.      . phenazopyridine (PYRIDIUM) 200 MG tablet Take 1 tablet (200 mg total) by mouth 3 (three) times daily.  9 tablet  0  . promethazine (PHENERGAN) 25 MG tablet Take 25 mg by mouth every 6 (six) hours as needed for nausea or vomiting.        Review of Systems  Constitutional: Negative for fever and malaise/fatigue.  Gastrointestinal:  Positive for nausea, vomiting, abdominal pain and diarrhea. Negative for constipation.  Genitourinary: Negative for dysuria, urgency, frequency and flank pain.       Neg - vaginal bleeding + vaginal discharge   Physical Exam   Blood pressure 126/60, pulse 73, temperature 98.7 F (37.1 C), temperature source Oral, resp. rate 18, height 5\' 8"  (1.727 m), weight 165 lb (74.844 kg), last menstrual period 12/31/2012, SpO2 100.00%.  Physical Exam  Constitutional: She is oriented to person, place, and time. She appears well-developed and well-nourished. No distress.  HENT:  Head: Normocephalic and atraumatic.  Cardiovascular: Normal rate, regular rhythm and normal heart sounds.   Respiratory: Effort normal and breath sounds normal. No respiratory distress.  GI: Soft. Bowel sounds are normal. She exhibits no distension and no mass. There is tenderness (very mild tenderness to palpation of the lower abdomen at midline). There is no rebound and no guarding.  Genitourinary: Uterus is enlarged (appropriate for GA). Uterus is not tender. Cervix exhibits no motion tenderness, no discharge and no friability. No bleeding around the vagina. Vaginal discharge (moderate amount of thin, white discharge noted) found.  Neurological: She is alert and oriented to person, place, and time.  Skin: Skin is warm and dry.  No erythema.  Psychiatric: She has a normal mood and affect.   Results for orders placed during the hospital encounter of 03/14/13 (from the past 24 hour(s))  URINALYSIS, ROUTINE W REFLEX MICROSCOPIC     Status: Abnormal   Collection Time    03/14/13 10:07 PM      Result Value Range   Color, Urine YELLOW  YELLOW   APPearance CLEAR  CLEAR   Specific Gravity, Urine >1.030 (*) 1.005 - 1.030   pH 6.0  5.0 - 8.0   Glucose, UA NEGATIVE  NEGATIVE mg/dL   Hgb urine dipstick SMALL (*) NEGATIVE   Bilirubin Urine NEGATIVE  NEGATIVE   Ketones, ur NEGATIVE  NEGATIVE mg/dL   Protein, ur NEGATIVE  NEGATIVE  mg/dL   Urobilinogen, UA 0.2  0.0 - 1.0 mg/dL   Nitrite NEGATIVE  NEGATIVE   Leukocytes, UA SMALL (*) NEGATIVE  URINE MICROSCOPIC-ADD ON     Status: Abnormal   Collection Time    03/14/13 10:07 PM      Result Value Range   Squamous Epithelial / LPF RARE  RARE   WBC, UA 3-6  <3 WBC/hpf   RBC / HPF 3-6  <3 RBC/hpf   Bacteria, UA FEW (*) RARE  WET PREP, GENITAL     Status: Abnormal   Collection Time    03/14/13 10:35 PM      Result Value Range   Yeast Wet Prep HPF POC NONE SEEN  NONE SEEN   Trich, Wet Prep NONE SEEN  NONE SEEN   Clue Cells Wet Prep HPF POC NONE SEEN  NONE SEEN   WBC, Wet Prep HPF POC MODERATE BACTERIA SEEN (*) NONE SEEN    MAU Course  Procedures None  MDM FHR - 180 bpm by doppler UA, wet prep and GC/Chlamydia today Discussed patient with Dr. Langston Masker. Ok for discharge. Increase PO hydration and follow-up in the office as scheduled  Assessment and Plan  A: Abdominal pain in pregnancy, first trimester  P: Discharge home Patient encouraged to increase PO hydration as tolerated Patient advised to keep scheduled follow-up in the office Patient may return to MAU as needed or if her condition were to change or worsen  Freddi Starr, PA-C  03/14/2013, 10:58 PM

## 2013-03-14 NOTE — Discharge Instructions (Signed)
Abdominal Pain During Pregnancy  Belly (abdominal) pain is common during pregnancy. Most of the time, it is not a serious problem. Other times, it can be a sign that something is wrong with the pregnancy. Always tell your doctor if you have belly pain.  HOME CARE  Monitor your belly pain for any changes. The following actions may help you feel better:  · Do not have sex (intercourse) or put anything in your vagina until you feel better.  · Rest until your pain stops.  · Drink clear fluids if you feel sick to your stomach (nauseous). Do not eat solid food until you feel better.  · Only take medicine as told by your doctor.  · Keep all doctor visits as told.  GET HELP RIGHT AWAY IF:   · You are bleeding, leaking fluid, or pieces of tissue come out of your vagina.  · You have more pain or cramping.  · You keep throwing up (vomiting).  · You have pain when you pee (urinate) or have blood in your pee.  · You have a fever.  · You do not feel your baby moving as much.  · You feel very weak or feel like passing out.  · You have trouble breathing, with or without belly pain.  · You have a very bad headache and belly pain.  · You have fluid leaking from your vagina and belly pain.  · You keep having watery poop (diarrhea).  · Your belly pain does not go away after resting, or the pain gets worse.  MAKE SURE YOU:   · Understand these instructions.  · Will watch your condition.  · Will get help right away if you are not doing well or get worse.  Document Released: 04/05/2009 Document Revised: 12/18/2012 Document Reviewed: 11/14/2012  [employer]® Patient Information ©2014 [employer].

## 2013-03-15 LAB — GC/CHLAMYDIA PROBE AMP: CT Probe RNA: NEGATIVE

## 2013-03-16 LAB — URINE CULTURE

## 2013-05-01 NOTE — L&D Delivery Note (Signed)
Delivery Note At 4:00 AM a healthy female was delivered via Vaginal, Spontaneous Delivery (Presentation: ; Occiput Anterior).  APGAR: 9, 9; weight .   Placenta status: Intact, Spontaneous.  Cord: 3 vessels with the following complications: None.  Cord pH: not sent  Anesthesia: Epidural  Episiotomy: None Lacerations: 1st degree Suture Repair: 3-0 chromic Est. Blood Loss (mL): 300  Mom to to mother baby   Baby to nursery  Amber Newton 09/23/2013, 4:32 AM

## 2013-06-23 ENCOUNTER — Inpatient Hospital Stay (HOSPITAL_COMMUNITY): Payer: Medicaid Other

## 2013-06-23 ENCOUNTER — Encounter (HOSPITAL_COMMUNITY): Payer: Self-pay | Admitting: *Deleted

## 2013-06-23 ENCOUNTER — Inpatient Hospital Stay (HOSPITAL_COMMUNITY)
Admission: AD | Admit: 2013-06-23 | Discharge: 2013-06-30 | DRG: 781 | Disposition: A | Discharge status: Home / Self Care | Payer: Medicaid Other | Source: Ambulatory Visit | Attending: Obstetrics and Gynecology | Admitting: Obstetrics and Gynecology

## 2013-06-23 DIAGNOSIS — B9689 Other specified bacterial agents as the cause of diseases classified elsewhere: Secondary | ICD-10-CM | POA: Diagnosis present

## 2013-06-23 DIAGNOSIS — N760 Acute vaginitis: Secondary | ICD-10-CM | POA: Diagnosis present

## 2013-06-23 DIAGNOSIS — O4590 Premature separation of placenta, unspecified, unspecified trimester: Secondary | ICD-10-CM | POA: Diagnosis present

## 2013-06-23 DIAGNOSIS — O2390 Unspecified genitourinary tract infection in pregnancy, unspecified trimester: Secondary | ICD-10-CM | POA: Diagnosis present

## 2013-06-23 DIAGNOSIS — O26879 Cervical shortening, unspecified trimester: Secondary | ICD-10-CM | POA: Diagnosis present

## 2013-06-23 DIAGNOSIS — A499 Bacterial infection, unspecified: Secondary | ICD-10-CM | POA: Diagnosis present

## 2013-06-23 DIAGNOSIS — Z87891 Personal history of nicotine dependence: Secondary | ICD-10-CM

## 2013-06-23 DIAGNOSIS — N390 Urinary tract infection, site not specified: Secondary | ICD-10-CM | POA: Diagnosis present

## 2013-06-23 DIAGNOSIS — O4690 Antepartum hemorrhage, unspecified, unspecified trimester: Secondary | ICD-10-CM | POA: Diagnosis present

## 2013-06-23 LAB — CBC
HEMATOCRIT: 31.3 % — AB (ref 36.0–46.0)
Hemoglobin: 10.5 g/dL — ABNORMAL LOW (ref 12.0–15.0)
MCH: 27.7 pg (ref 26.0–34.0)
MCHC: 33.5 g/dL (ref 30.0–36.0)
MCV: 82.6 fL (ref 78.0–100.0)
Platelets: 235 10*3/uL (ref 150–400)
RBC: 3.79 MIL/uL — AB (ref 3.87–5.11)
RDW: 12.7 % (ref 11.5–15.5)
WBC: 8.7 10*3/uL (ref 4.0–10.5)

## 2013-06-23 LAB — COMPREHENSIVE METABOLIC PANEL
ALBUMIN: 2.8 g/dL — AB (ref 3.5–5.2)
ALT: 10 U/L (ref 0–35)
AST: 14 U/L (ref 0–37)
Alkaline Phosphatase: 72 U/L (ref 39–117)
BUN: 5 mg/dL — AB (ref 6–23)
CO2: 23 mEq/L (ref 19–32)
CREATININE: 0.63 mg/dL (ref 0.50–1.10)
Calcium: 9.2 mg/dL (ref 8.4–10.5)
Chloride: 101 mEq/L (ref 96–112)
GFR calc Af Amer: >90 mL/min (ref >90)
GFR calc non Af Amer: >90 mL/min (ref >90)
Glucose, Bld: 78 mg/dL (ref 70–99)
Potassium: 4.1 mEq/L (ref 3.7–5.3)
Sodium: 135 mEq/L — ABNORMAL LOW (ref 137–147)
TOTAL PROTEIN: 7.1 g/dL (ref 6.0–8.3)
Total Bilirubin: 0.2 mg/dL — ABNORMAL LOW (ref 0.3–1.2)

## 2013-06-23 LAB — URINE MICROSCOPIC-ADD ON

## 2013-06-23 LAB — URINALYSIS, ROUTINE W REFLEX MICROSCOPIC
BILIRUBIN URINE: NEGATIVE
Glucose, UA: 100 mg/dL — AB
Ketones, ur: NEGATIVE mg/dL
Nitrite: NEGATIVE
PROTEIN: NEGATIVE mg/dL
UROBILINOGEN UA: 0.2 mg/dL (ref 0.0–1.0)
pH: 6.5 (ref 5.0–8.0)

## 2013-06-23 LAB — WET PREP, GENITAL
TRICH WET PREP: NONE SEEN
Yeast Wet Prep HPF POC: NONE SEEN

## 2013-06-23 MED ORDER — DOCUSATE SODIUM 100 MG PO CAPS
100.0000 mg | ORAL_CAPSULE | Freq: Every day | ORAL | Status: DC
  Administered 2013-06-24 – 2013-06-28 (×5): 100 mg via ORAL
  Filled 2013-06-23 (×5): qty 1

## 2013-06-23 MED ORDER — ZOLPIDEM TARTRATE 5 MG PO TABS: 5.0000 mg | ORAL_TABLET | Freq: Every evening | ORAL | Status: DC | PRN

## 2013-06-23 MED ORDER — LACTATED RINGERS IV SOLN
INTRAVENOUS | Status: DC
Start: 2013-06-23 — End: 2013-06-30
  Administered 2013-06-23 – 2013-06-25 (×5): via INTRAVENOUS

## 2013-06-23 MED ORDER — BETAMETHASONE SOD PHOS & ACET 6 (3-3) MG/ML IJ SUSP
12.0000 mg | INTRAMUSCULAR | Status: AC
Start: 2013-06-23 — End: 2013-06-24
  Administered 2013-06-23 – 2013-06-24 (×2): 12 mg via INTRAMUSCULAR
  Filled 2013-06-23 (×2): qty 2

## 2013-06-23 MED ORDER — ACETAMINOPHEN 325 MG PO TABS
650.0000 mg | ORAL_TABLET | ORAL | Status: DC | PRN
  Administered 2013-06-24 – 2013-06-25 (×2): 650 mg via ORAL
  Filled 2013-06-23 (×2): qty 2

## 2013-06-23 MED ORDER — CALCIUM CARBONATE ANTACID 500 MG PO CHEW
2.0000 | CHEWABLE_TABLET | ORAL | Status: DC | PRN
Start: 2013-06-23 — End: 2013-06-30
  Administered 2013-06-25 – 2013-06-26 (×2): 400 mg via ORAL
  Filled 2013-06-23 (×2): qty 1

## 2013-06-23 MED ORDER — PRENATAL MULTIVITAMIN CH
1.0000 | ORAL_TABLET | Freq: Every day | ORAL | Status: DC
  Administered 2013-06-24 – 2013-06-29 (×6): 1 via ORAL
  Filled 2013-06-23 (×6): qty 1

## 2013-06-23 NOTE — MAU Note (Signed)
Pt presents with complaints of bright red vaginal bleeding for a couple of days but states that the bleeding is getting worse today. States mild abdominal cramping

## 2013-06-23 NOTE — MAU Provider Note (Signed)
History     CSN: [id number]  Arrival date and time: 06/23/13 1231   First Provider Initiated Contact with Patient 06/23/13 1319      Chief Complaint  Patient presents with  . Vaginal Bleeding   HPI Comments: Amber Newton 28 y.o. [phone] presents to MAU with vaginal bleeding off and on for 4 days. She sees blood when she wipes and has bowel movement. She had a vaginal discharge starting 2 weeks ago a vaginal lump that resolved and now the bleeding. Last Ultrasound was Feb 11th and she has not been told she has any problems with placenta. She has good fetal movement, some Braxton-Hicks contractions. Denies any leaking of fluid. Last intercourse was one week ago.  Vaginal Bleeding      Past Medical History  Diagnosis Date  . Abnormal Pap smear     HPV, colpo  . Infection     UTI  . JYNWGNFA(213.0Headache(784.0)     Past Surgical History  Procedure Laterality Date  . No past surgeries      Family History  Problem Relation Age of Onset  . Hypertension Mother   . Cancer Father   . Cancer Maternal Aunt   . Hypertension Maternal Grandmother   . Diabetes Maternal Grandmother   . Diabetes Paternal Grandmother   . Cancer Paternal Grandfather     History  Substance Use Topics  . Smoking status: Former Games developermoker  . Smokeless tobacco: Never Used  . Alcohol Use: No    Allergies: No Known Allergies  Prescriptions prior to admission  Medication Sig Dispense Refill  . ondansetron (ZOFRAN-ODT) 4 MG disintegrating tablet Take 4 mg by mouth every 8 (eight) hours as needed for nausea or vomiting.      . promethazine (PHENERGAN) 25 MG tablet Take 25 mg by mouth every 6 (six) hours as needed for nausea or vomiting.        Review of Systems  Constitutional: Negative.   Eyes: Negative.   Respiratory: Negative.   Cardiovascular: Negative.   Genitourinary: Positive for vaginal bleeding.       Vaginal discharge and bleeding  Musculoskeletal: Negative.   Skin: Negative.   Neurological:  Negative.   Psychiatric/Behavioral: Negative.    Physical Exam   Blood pressure 111/73, pulse 102, temperature 97.7 F (36.5 C), resp. rate 18, height 5\' 6"  (1.676 m), weight 79.379 kg (175 lb), last menstrual period 12/31/2012.  Physical Exam  Constitutional: She appears well-developed and well-nourished. No distress.  HENT:  Head: Normocephalic and atraumatic.  Eyes: Pupils are equal, round, and reactive to light.  Cardiovascular: Normal rate, regular rhythm and normal heart sounds.   Respiratory: Effort normal and breath sounds normal.  GI: Soft. Bowel sounds are normal. She exhibits no distension and no mass. There is no tenderness. There is no rebound and no guarding.  Genitourinary:  Genital:external: no lesions or tears Vaginal:moderate amount of pink, odorous discharge with small amount blood from os Cervix:blood, closed, long Bimanual:uterus enlarged with pregnancy/ non tender    Results for orders placed during the hospital encounter of 06/23/13 (from the past 24 hour(s))  URINALYSIS, ROUTINE W REFLEX MICROSCOPIC     Status: Abnormal   Collection Time    06/23/13 12:45 PM      Result Value Ref Range   Color, Urine YELLOW  YELLOW   APPearance HAZY (*) CLEAR   Specific Gravity, Urine >1.030 (*) 1.005 - 1.030   pH 6.5  5.0 - 8.0   Glucose, UA 100 (*)  NEGATIVE mg/dL   Hgb urine dipstick LARGE (*) NEGATIVE   Bilirubin Urine NEGATIVE  NEGATIVE   Ketones, ur NEGATIVE  NEGATIVE mg/dL   Protein, ur NEGATIVE  NEGATIVE mg/dL   Urobilinogen, UA 0.2  0.0 - 1.0 mg/dL   Nitrite NEGATIVE  NEGATIVE   Leukocytes, UA SMALL (*) NEGATIVE  URINE MICROSCOPIC-ADD ON     Status: Abnormal   Collection Time    06/23/13 12:45 PM      Result Value Ref Range   Squamous Epithelial / LPF FEW (*) RARE   WBC, UA 11-20  <3 WBC/hpf   RBC / HPF 7-10  <3 RBC/hpf   Bacteria, UA MANY (*) RARE   Crystals CA OXALATE CRYSTALS (*) NEGATIVE   Urine-Other MUCOUS PRESENT    WET PREP, GENITAL     Status:  Abnormal   Collection Time    06/23/13  1:20 PM      Result Value Ref Range   Yeast Wet Prep HPF POC NONE SEEN  NONE SEEN   Trich, Wet Prep NONE SEEN  NONE SEEN   Clue Cells Wet Prep HPF POC FEW (*) NONE SEEN   WBC, Wet Prep HPF POC MODERATE (*) NONE SEEN     MAU Course  Procedures  MDM  Wet Prep, GC/ Chlamydia Spoke with Dr Henderson Cloud who advised U/S After U/S Dr Henderson Cloud advised 23 hour observation   Assessment and Plan   A: Bleeding in second trimester  P: Admit/ See orders  Carolynn Serve 06/23/2013, 1:19 PM

## 2013-06-23 NOTE — H&P (Signed)
Amber Newton is a 28 y.o. female presenting for vaginal bleeding for 3-4 days. No PROM, no fever, no HA, no abdominal pain or trauma. Labs and U/S OK in MAU. Small amount of blood from cervix noted by CNM in MAU. No regular UCs. Patient c/o "Amber PeltonBraxton Hicks" about once/hour. Maternal Medical History:  Reason for admission: Vaginal bleeding.     OB History   Grav Para Term Preterm Abortions TAB SAB Ect Mult Living   3 1 1  0 1 0 1 0 0 1     Past Medical History  Diagnosis Date  . Abnormal Pap smear     HPV, colpo  . Infection     UTI  . ZOXWRUEA(540.9Headache(784.0)    Past Surgical History  Procedure Laterality Date  . No past surgeries     Family History: family history includes Cancer in her father, maternal aunt, and paternal grandfather; Diabetes in her maternal grandmother and paternal grandmother; Hypertension in her maternal grandmother and mother. Social History:  reports that she has quit smoking. She has never used smokeless tobacco. She reports that she does not drink alcohol or use illicit drugs.   Prenatal Transfer Tool  Maternal Diabetes: No Genetic Screening: Normal Maternal Ultrasounds/Referrals: Normal Fetal Ultrasounds or other Referrals:  None Maternal Substance Abuse:  No Significant Maternal Medications:  None Significant Maternal Lab Results:  None Other Comments:  None  Review of Systems  Constitutional: Negative for fever.  Eyes: Negative for blurred vision.  Gastrointestinal: Negative for abdominal pain.  Neurological: Negative for headaches.    Dilation: Closed Effacement (%): Thick Station: -3 Blood pressure 122/72, pulse 100, temperature 98 F (36.7 C), temperature source Oral, resp. rate 20, height 5\' 6"  (1.676 m), weight 175 lb (79.379 kg), last menstrual period 12/31/2012. Maternal Exam:  Uterine Assessment: Contraction strength is moderate.  Contraction frequency is rare.      Fetal Exam Fetal State Assessment: Category I - tracings are  normal.     Physical Exam  Cardiovascular: Normal rate and regular rhythm.   Respiratory: Effort normal and breath sounds normal.  GI: Soft. There is no tenderness.  Neurological: She has normal reflexes.   I palpated a moderate UC lasting 30 seconds. UCs about 1/hour Prenatal labs: ABO, Rh:   Antibody: Negative (10/30 0000) Rubella: Immune (10/30 0000) RPR: Nonreactive (10/30 0000)  HBsAg: Negative (10/30 0000)  HIV: Non-reactive (10/30 0000)  GBS:     Assessment/Plan: 28 yo G3P1 at 24 6/7 weeks with vaginal bleeding.   BMTZ IV fluids Fetal monitors Observation  If regular UCs will begin magnesium sulfate  Amber Newton,Amber Newton 06/23/2013, 8:59 PM

## 2013-06-24 LAB — TYPE AND SCREEN
ABO/RH(D): A POS
Antibody Screen: NEGATIVE

## 2013-06-24 LAB — OB RESULTS CONSOLE GC/CHLAMYDIA
Chlamydia: NEGATIVE
GC PROBE AMP, GENITAL: NEGATIVE

## 2013-06-24 LAB — GC/CHLAMYDIA PROBE AMP
CT PROBE, AMP APTIMA: NEGATIVE
GC PROBE AMP APTIMA: NEGATIVE

## 2013-06-24 MED ORDER — DEXTROSE 5 % IV SOLN
1.0000 g | Freq: Two times a day (BID) | INTRAVENOUS | Status: DC
  Administered 2013-06-24 – 2013-06-25 (×4): 1 g via INTRAVENOUS
  Filled 2013-06-24 (×5): qty 10

## 2013-06-24 MED ORDER — METRONIDAZOLE 500 MG PO TABS
500.0000 mg | ORAL_TABLET | Freq: Two times a day (BID) | ORAL | Status: DC
  Administered 2013-06-24 – 2013-06-29 (×12): 500 mg via ORAL
  Filled 2013-06-24 (×13): qty 1

## 2013-06-24 NOTE — Progress Notes (Signed)
Patient ID: Amber Newton, female   DOB: 04/06/1986, 28 y.o.   MRN: [id number] Pt without complaints.  No flank pain.  Does have urinary frequency.  GFM.  Still spots of red blood when going to bathroom VSSAF FHR 130s Cat 1 Ctxs Rare Abd Gravid Nt  Flank no CVA tenderness Neg homans  UA +WBC and Bacteria Wet prep + clue  Vaginal bleeding at 25 weeks.  ? Marginal abruption v PTL Stable - no evidence of PTL.  Cont hospitalization until no bleeding BMZ UTI v early pyelo - IV Rocephin x 24 h then change to PO BV- flagyl DL

## 2013-06-24 NOTE — Progress Notes (Signed)
Toco removed.  She will advise the nurse if she feels uterine activity and we will place her back on the monitor.

## 2013-06-25 DIAGNOSIS — O26879 Cervical shortening, unspecified trimester: Secondary | ICD-10-CM | POA: Diagnosis present

## 2013-06-25 NOTE — Progress Notes (Signed)
S:  Patient is doing ok but did notice some cramping and light bright red bleeding early this am. The baby is moving well. She is otherwise ok. No clotting. No severe abdominal pain. Mild low back pain.  O:  Afebrile VSS General alert and oriented Abdomen No CVAT Uterus is soft and only slightly tender on right side near the round ligament.  IMPRESSION:  IUP at 25 w 1 day Vaginal Bleeding Probable marginal abruption  PLAN: Continue in hospital management Complete steroids I will review ultrasound performed yesterday. Report is pending in EPIC. Plan of care discussed with patient. If fetal distress occurs will proceed with C Section

## 2013-06-25 NOTE — Consult Note (Signed)
Neonatology Consult Note:  At the request of the patients obstetrician Dr. Helane Rima I met with Amber Newton (and FOB) who is at 25 1 weeks currently.  She presented due to vaginal bleeding x  3-4 days. No PROM.  She was given BMZ x 2 and has been diagnosed with likely marginal abruption.  No evidence of PTL.   Also diagnosed with UTI vs. early pyelonephritis and is being treated with antibiotics.  Also with BV- flagyl.     We discussed morbidity/mortality at this gestional age (72-75% survival at this gestation), delivery room resuscitation, including intubation and surfactant in DR.  Discussed mechanical ventilation and risk for chronic lung disease, risk for IVH with potential for motor / cognitive deficits, ROP, NEC, sepsis, as well as temperature instability and feeding immaturity.  Discussed NG / OG feeds, benefits of MBM in reducing incidence of NEC.   Discussed likely length of stay.  Thank you for allowing [place] to participate in her care.    Higinio Roger, DO  Neonatologist  The total length of face-to-face or floor / unit time for this encounter was 25 minutes.  Counseling and / or coordination of care was greater than fifty percent of the time.

## 2013-06-26 LAB — FIBRINOGEN: Fibrinogen: 348 mg/dL (ref 204–475)

## 2013-06-26 MED ORDER — RANITIDINE HCL 150 MG/10ML PO SYRP: 150.0000 mg | ORAL_SOLUTION | Freq: Every day | ORAL | Status: DC

## 2013-06-26 MED ORDER — NITROFURANTOIN MONOHYD MACRO 100 MG PO CAPS
100.0000 mg | ORAL_CAPSULE | Freq: Two times a day (BID) | ORAL | Status: DC
  Administered 2013-06-26 – 2013-06-29 (×8): 100 mg via ORAL
  Filled 2013-06-26 (×9): qty 1

## 2013-06-26 MED ORDER — FAMOTIDINE 20 MG PO TABS
20.0000 mg | ORAL_TABLET | Freq: Every day | ORAL | Status: DC
  Administered 2013-06-26 – 2013-06-29 (×4): 20 mg via ORAL
  Filled 2013-06-26 (×4): qty 1

## 2013-06-26 MED ORDER — SODIUM CHLORIDE 0.9 % IJ SOLN
3.0000 mL | Freq: Two times a day (BID) | INTRAMUSCULAR | Status: DC
  Administered 2013-06-26 – 2013-06-28 (×5): 3 mL via INTRAVENOUS

## 2013-06-26 NOTE — Progress Notes (Signed)
No c/o.  No bright red vaginal bleeding since Tuesday.  +FM.    VSS. AF FHT: Cat I Toco: irritability at times Gen: A&O x 3 Abd: soft, NT, soft Ext: no c/c/e  27yo G3P1011 at 6642w2d with likely marginal abruption -BMZ mature -Plan to keep in house until at least 1 week without bleeding -Changed to po abx for empiric tx of UTI/early pyelo -BV-continue flagyl  MM

## 2013-06-27 LAB — TYPE AND SCREEN
ABO/RH(D): A POS
ANTIBODY SCREEN: NEGATIVE

## 2013-06-27 NOTE — Progress Notes (Signed)
Patient ID: [name], female   DOB: 10/05/1985, 28 y.o.   MRN: [id number] S: SOME SPOTTING YESTERDAY O:  AF VSS       GRAVID UTERUS NONTENDER       FHR CAT ONE NO CTX A:  IUP AT 25.3 WITH MARGINAL ABRUPTION P:  CONTINUE OBSERVATION

## 2013-06-28 MED ORDER — MAGNESIUM HYDROXIDE 400 MG/5ML PO SUSP
15.0000 mL | Freq: Every day | ORAL | Status: DC | PRN
  Filled 2013-06-28: qty 30

## 2013-06-28 NOTE — Progress Notes (Signed)
Patient ID: Amber Newton, female   DOB: 10/11/1985, 28 y.o.   MRN: [id number] S: NO BLEEDING O: AF VSS      GRAVID UTERUS NONTENDER      FHR CAT ONE A: IUP AT 25.4 WITH MARGINAL ABRUPTION P: CONTINUE OBSERVATION

## 2013-06-29 NOTE — Progress Notes (Signed)
Patient ID: [name], female   DOB: 08/26/1985, 28 y.o.   MRN: [id number] S: NO BLEEDING GOOD FETAL MOVEMENT O: AF VSS     GRAVID UTERUS NONTENEDER     TRACING CAT ONE A:  IUP AT 25.5 WITH MARGINAL ABRUPTION P:  MAY D/C IN AM

## 2013-06-30 LAB — TYPE AND SCREEN
ABO/RH(D): A POS
Antibody Screen: NEGATIVE

## 2013-06-30 MED ORDER — PRENATAL MULTIVITAMIN CH: 1.0000 | ORAL_TABLET | Freq: Every day | ORAL | Status: DC

## 2013-06-30 NOTE — Discharge Instructions (Signed)
Vaginal Bleeding During Pregnancy  A small amount of bleeding from the vagina can happen anytime during pregnancy. It usually stops on its own. However, some bleeding can be serious. Be sure to tell your doctor about all vaginal bleeding.  HOME CARE   Get plenty of rest and sleep.   Stay in bed and only get up to go to the bathroom as told by your doctor.   Write down the number of pads you use each day. Note how soaked they are.   Do not use tampons. Do not clean the vagina with a stream of water (douche).   Do not have sex (intercourse) or put anything into your vagina. Have this approved by your doctor.   Save any tissue that comes from your vagina. Show it to your doctor.   Only take medicine as told by your doctor.   Follow your doctor's advice about lifting, driving, and physical activity.  GET HELP RIGHT AWAY IF:    You feel your baby move less or not at all.   You pass out (faint) while going to the bathroom.   You have more bleeding.   You start to have contractions.   You have severe cramps in your stomach, back, or belly (abdomen).   You are leaking fluid or have a gush of fluid from your vagina.   You become lightheaded or weak.   You have chills.   You have clumps of tissue or blood clots coming from your vagina.   You have a fever.  MAKE SURE YOU:    Understand these instructions.   Will watch your condition.   Will get help right away if you are not doing well or get worse.  Document Released: 01/25/2008 Document Revised: 04/03/2012 Document Reviewed: 02/05/2012  Exit[employer] Patient Information 2014 [employer].

## 2013-06-30 NOTE — Progress Notes (Signed)
No bleeding, ctx or lof.  Good FM  + FHT Gen - NAD Abd - gravid, NT Ext - NT  A/P:  Marginal abruption D/c home Fu office 1 week

## 2013-06-30 NOTE — Progress Notes (Signed)
Pt taken off the monitor after reassurring FHR  

## 2013-06-30 NOTE — Progress Notes (Signed)
Pt given discharge instructions and pt verlbalizes understanding.

## 2013-06-30 NOTE — Discharge Summary (Signed)
Physician Discharge Summary  Patient ID: [name] MRN: [id number] DOB/AGE: 26/10/1985 27 y.o.  Admit date: 06/23/2013 Discharge date: 06/30/2013  Admission Diagnoses: vaginal bleeding  Discharge Diagnoses:  Active Problems:   Vaginal bleeding in pregnancy   Short cervix, antepartum   Discharged Condition: stable  Hospital Course: Pt was admitted after an episode of vaginal bleeding.  She received BMZ x 2 and remained stable during her hospitalization.  After 1 week of no further vaginal bleeding, she was felt to be stable for discharge home.    Consults: None  Significant Diagnostic Studies: ultrasound  Treatments: steroids: BMZ  Discharge Exam: Blood pressure 113/66, pulse 99, temperature 98.4 F (36.9 C), temperature source Oral, resp. rate 18, height 5\' 6"  (1.676 m), weight 81.058 kg (178 lb 11.2 oz), last menstrual period 12/31/2012. General appearance: alert and cooperative GI: gravid, NT Extremities: extremities normal, atraumatic, no cyanosis or edema  Disposition: 01-Home or Self Care     Medication List         acetaminophen 500 MG tablet  Commonly known as:  TYLENOL  Take 1,000 mg by mouth every 6 (six) hours as needed for headache.     prenatal multivitamin Tabs tablet  Take 1 tablet by mouth daily at 12 noon.     promethazine 25 MG tablet  Commonly known as:  PHENERGAN  Take 25 mg by mouth every 6 (six) hours as needed for nausea or vomiting.           Follow-up Information   Schedule an appointment as soon as possible for a visit in 1 week to follow up.      Signed: Erionna Strum 06/30/2013, 8:52 AM

## 2013-09-14 ENCOUNTER — Inpatient Hospital Stay (HOSPITAL_COMMUNITY)
Admission: AD | Admit: 2013-09-14 | Discharge: 2013-09-15 | Disposition: A | Discharge status: Home / Self Care | Payer: Medicaid Other | Source: Ambulatory Visit | Attending: Obstetrics and Gynecology | Admitting: Obstetrics and Gynecology

## 2013-09-14 ENCOUNTER — Encounter (HOSPITAL_COMMUNITY): Payer: Self-pay | Admitting: *Deleted

## 2013-09-14 DIAGNOSIS — O4700 False labor before 37 completed weeks of gestation, unspecified trimester: Secondary | ICD-10-CM | POA: Insufficient documentation

## 2013-09-14 DIAGNOSIS — O4690 Antepartum hemorrhage, unspecified, unspecified trimester: Secondary | ICD-10-CM | POA: Insufficient documentation

## 2013-09-14 NOTE — MAU Note (Signed)
Pt G3 P1 at 36.5wks with bleeding earlier this evening, and contractions every .  Pt reports hx of placental abruption at 26wks.

## 2013-09-15 ENCOUNTER — Encounter (HOSPITAL_COMMUNITY): Payer: Self-pay

## 2013-09-15 NOTE — Discharge Instructions (Signed)
Braxton Hicks Contractions Pregnancy is commonly associated with contractions of the uterus throughout the pregnancy. Towards the end of pregnancy (32 to 34 weeks), these contractions (Braxton Hicks) can develop more often and may become more forceful. This is not true labor because these contractions do not result in opening (dilatation) and thinning of the cervix. They are sometimes difficult to tell apart from true labor because these contractions can be forceful and people have different pain tolerances. You should not feel embarrassed if you go to the hospital with false labor. Sometimes, the only way to tell if you are in true labor is for your caregiver to follow the changes in the cervix. How to tell the difference between true and false labor:  False labor.  The contractions of false labor are usually shorter, irregular and not as hard as those of true labor.  They are often felt in the front of the lower abdomen and in the groin.  They may leave with walking around or changing positions while lying down.  They get weaker and are shorter lasting as time goes on.  These contractions are usually irregular.  They do not usually become progressively stronger, regular and closer together as with true labor.  True labor.  Contractions in true labor last 30 to 70 seconds, become very regular, usually become more intense, and increase in frequency.  They do not go away with walking.  The discomfort is usually felt in the top of the uterus and spreads to the lower abdomen and low back.  True labor can be determined by your caregiver with an exam. This will show that the cervix is dilating and getting thinner. If there are no prenatal problems or other health problems associated with the pregnancy, it is completely safe to be sent home with false labor and await the onset of true labor. HOME CARE INSTRUCTIONS   Keep up with your usual exercises and instructions.  Take medications as  directed.  Keep your regular prenatal appointment.  Eat and drink lightly if you think you are going into labor.  If BH contractions are making you uncomfortable:  Change your activity position from lying down or resting to walking/walking to resting.  Sit and rest in a tub of warm water.  Drink 2 to 3 glasses of water. Dehydration may cause B-H contractions.  Do slow and deep breathing several times an hour. SEEK IMMEDIATE MEDICAL CARE IF:   Your contractions continue to become stronger, more regular, and closer together.  You have a gushing, burst or leaking of fluid from the vagina.  An oral temperature above 102 F (38.9 C) develops.  You have passage of blood-tinged mucus.  You develop vaginal bleeding.  You develop continuous belly (abdominal) pain.  You have low back pain that you never had before.  You feel the baby's head pushing down causing pelvic pressure.  The baby is not moving as much as it used to. Document Released: 04/17/2005 Document Revised: 07/10/2011 Document Reviewed: 01/27/2013 Exit[employer] Patient Information 2014 [employer].  Fetal Movement Counts Patient Name: __________________________________________________ Patient Due Date: ____________________ Performing a fetal movement count is highly recommended in high-risk pregnancies, but it is good for every pregnant woman to do. Your caregiver may ask you to start counting fetal movements at 28 weeks of the pregnancy. Fetal movements often increase:  After eating a full meal.  After physical activity.  After eating or drinking something sweet or cold.  At rest. Pay attention to when you feel   the baby is most active. This will help you notice a pattern of your baby's sleep and wake cycles and what factors contribute to an increase in fetal movement. It is important to perform a fetal movement count at the same time each day when your baby is normally most active.  HOW TO COUNT FETAL  MOVEMENTS 1. Find a quiet and comfortable area to sit or lie down on your left side. Lying on your left side provides the best blood and oxygen circulation to your baby. 2. Write down the day and time on a sheet of paper or in a journal. 3. Start counting kicks, flutters, swishes, rolls, or jabs in a 2 hour period. You should feel at least 10 movements within 2 hours. 4. If you do not feel 10 movements in 2 hours, wait 2 3 hours and count again. Look for a change in the pattern or not enough counts in 2 hours. SEEK MEDICAL CARE IF:  You feel less than 10 counts in 2 hours, tried twice.  There is no movement in over an hour.  The pattern is changing or taking longer each day to reach 10 counts in 2 hours.  You feel the baby is not moving as he or she usually does. Date: ____________ Movements: ____________ Start time: ____________ Finish time: ____________  Date: ____________ Movements: ____________ Start time: ____________ Finish time: ____________ Date: ____________ Movements: ____________ Start time: ____________ Finish time: ____________ Date: ____________ Movements: ____________ Start time: ____________ Finish time: ____________ Date: ____________ Movements: ____________ Start time: ____________ Finish time: ____________ Date: ____________ Movements: ____________ Start time: ____________ Finish time: ____________ Date: ____________ Movements: ____________ Start time: ____________ Finish time: ____________ Date: ____________ Movements: ____________ Start time: ____________ Finish time: ____________  Date: ____________ Movements: ____________ Start time: ____________ Finish time: ____________ Date: ____________ Movements: ____________ Start time: ____________ Finish time: ____________ Date: ____________ Movements: ____________ Start time: ____________ Finish time: ____________ Date: ____________ Movements: ____________ Start time: ____________ Finish time: ____________ Date: ____________  Movements: ____________ Start time: ____________ Finish time: ____________ Date: ____________ Movements: ____________ Start time: ____________ Finish time: ____________ Date: ____________ Movements: ____________ Start time: ____________ Finish time: ____________  Date: ____________ Movements: ____________ Start time: ____________ Finish time: ____________ Date: ____________ Movements: ____________ Start time: ____________ Finish time: ____________ Date: ____________ Movements: ____________ Start time: ____________ Finish time: ____________ Date: ____________ Movements: ____________ Start time: ____________ Finish time: ____________ Date: ____________ Movements: ____________ Start time: ____________ Finish time: ____________ Date: ____________ Movements: ____________ Start time: ____________ Finish time: ____________ Date: ____________ Movements: ____________ Start time: ____________ Finish time: ____________  Date: ____________ Movements: ____________ Start time: ____________ Finish time: ____________ Date: ____________ Movements: ____________ Start time: ____________ Finish time: ____________ Date: ____________ Movements: ____________ Start time: ____________ Finish time: ____________ Date: ____________ Movements: ____________ Start time: ____________ Finish time: ____________ Date: ____________ Movements: ____________ Start time: ____________ Finish time: ____________ Date: ____________ Movements: ____________ Start time: ____________ Finish time: ____________ Date: ____________ Movements: ____________ Start time: ____________ Finish time: ____________  Date: ____________ Movements: ____________ Start time: ____________ Finish time: ____________ Date: ____________ Movements: ____________ Start time: ____________ Finish time: ____________ Date: ____________ Movements: ____________ Start time: ____________ Finish time: ____________ Date: ____________ Movements: ____________ Start time:  ____________ Finish time: ____________ Date: ____________ Movements: ____________ Start time: ____________ Finish time: ____________ Date: ____________ Movements: ____________ Start time: ____________ Finish time: ____________ Date: ____________ Movements: ____________ Start time: ____________ Finish time: ____________  Date: ____________ Movements: ____________ Start time: ____________ Finish time: ____________ Date: ____________ Movements: ____________ Start   time: ____________ Finish time: ____________ Date: ____________ Movements: ____________ Start time: ____________ Finish time: ____________ Date: ____________ Movements: ____________ Start time: ____________ Finish time: ____________ Date: ____________ Movements: ____________ Start time: ____________ Finish time: ____________ Date: ____________ Movements: ____________ Start time: ____________ Finish time: ____________ Date: ____________ Movements: ____________ Start time: ____________ Finish time: ____________  Date: ____________ Movements: ____________ Start time: ____________ Finish time: ____________ Date: ____________ Movements: ____________ Start time: ____________ Finish time: ____________ Date: ____________ Movements: ____________ Start time: ____________ Finish time: ____________ Date: ____________ Movements: ____________ Start time: ____________ Finish time: ____________ Date: ____________ Movements: ____________ Start time: ____________ Finish time: ____________ Date: ____________ Movements: ____________ Start time: ____________ Finish time: ____________ Date: ____________ Movements: ____________ Start time: ____________ Finish time: ____________  Date: ____________ Movements: ____________ Start time: ____________ Finish time: ____________ Date: ____________ Movements: ____________ Start time: ____________ Finish time: ____________ Date: ____________ Movements: ____________ Start time: ____________ Finish time: ____________ Date:  ____________ Movements: ____________ Start time: ____________ Finish time: ____________ Date: ____________ Movements: ____________ Start time: ____________ Finish time: ____________ Date: ____________ Movements: ____________ Start time: ____________ Finish time: ____________ Document Released: 05/17/2006 Document Revised: 04/03/2012 Document Reviewed: 02/12/2012 [employer] Patient Information 2014 [employer].  

## 2013-09-18 ENCOUNTER — Inpatient Hospital Stay (HOSPITAL_COMMUNITY)
Admission: AD | Admit: 2013-09-18 | Discharge: 2013-09-19 | Disposition: A | Discharge status: Home / Self Care | Payer: Medicaid Other | Source: Ambulatory Visit | Attending: Obstetrics and Gynecology | Admitting: Obstetrics and Gynecology

## 2013-09-18 ENCOUNTER — Encounter (HOSPITAL_COMMUNITY): Payer: Self-pay | Admitting: *Deleted

## 2013-09-18 DIAGNOSIS — O479 False labor, unspecified: Secondary | ICD-10-CM | POA: Insufficient documentation

## 2013-09-18 DIAGNOSIS — O368390 Maternal care for abnormalities of the fetal heart rate or rhythm, unspecified trimester, not applicable or unspecified: Secondary | ICD-10-CM | POA: Insufficient documentation

## 2013-09-18 NOTE — MAU Note (Signed)
PT SAYS SHE STARTED HURTING BAD    AT 1030PM.   DR HOLLAND- VE -  FT .  DENIES HSV AND MRSA.  GBS- UNSURE.

## 2013-09-19 DIAGNOSIS — O479 False labor, unspecified: Secondary | ICD-10-CM

## 2013-09-19 DIAGNOSIS — O368390 Maternal care for abnormalities of the fetal heart rate or rhythm, unspecified trimester, not applicable or unspecified: Secondary | ICD-10-CM

## 2013-09-19 NOTE — MAU Provider Note (Signed)
HPI: Amber Newton is a 28 y.o. year old G40P1011 female at [redacted]w[redacted]d weeks gestation who presents to MAU reporting contractions. RN's unable to reach cervix x 2. CNM asked to perform VE   Difficulty reaching cervix on first exam, ? FT/-3, very posterior, vtx  Recheck x 2 Dilation: 1.5 Effacement (%): 60 Cervical Position: Posterior Station: -3 Presentation: Vertex Exam by:: Tanmay Halteman,CNM  EFM: FHR 120, moderate variability, 15 x15 accelerations, rare mild variable decelerations. Toco: Irregular, moderate  ASSESSMENT: False labor  PLAN: Discharge home in stable condition per consult with Dr. Renaldo Fiddler. Fetal kick counts and labor precautions. Comfort measures. Follow up with [facility] as scheduled or maternity admissions as needed if symptoms worsen. [place], CNM 09/19/2013 4:31 AM

## 2013-09-22 ENCOUNTER — Encounter (HOSPITAL_COMMUNITY): Payer: Medicaid Other | Admitting: Anesthesiology

## 2013-09-22 ENCOUNTER — Inpatient Hospital Stay (HOSPITAL_COMMUNITY): Payer: Medicaid Other | Admitting: Anesthesiology

## 2013-09-22 ENCOUNTER — Inpatient Hospital Stay (HOSPITAL_COMMUNITY)
Admission: AD | Admit: 2013-09-22 | Discharge: 2013-09-25 | DRG: 775 | Disposition: A | Discharge status: Home / Self Care | Payer: Medicaid Other | Source: Ambulatory Visit | Attending: Obstetrics and Gynecology | Admitting: Obstetrics and Gynecology

## 2013-09-22 DIAGNOSIS — D649 Anemia, unspecified: Secondary | ICD-10-CM | POA: Diagnosis present

## 2013-09-22 DIAGNOSIS — Z22330 Carrier of Group B streptococcus: Secondary | ICD-10-CM

## 2013-09-22 DIAGNOSIS — O9902 Anemia complicating childbirth: Secondary | ICD-10-CM | POA: Diagnosis present

## 2013-09-22 DIAGNOSIS — O99892 Other specified diseases and conditions complicating childbirth: Secondary | ICD-10-CM | POA: Diagnosis present

## 2013-09-22 DIAGNOSIS — O9989 Other specified diseases and conditions complicating pregnancy, childbirth and the puerperium: Secondary | ICD-10-CM

## 2013-09-22 DIAGNOSIS — Z87891 Personal history of nicotine dependence: Secondary | ICD-10-CM

## 2013-09-22 DIAGNOSIS — Z833 Family history of diabetes mellitus: Secondary | ICD-10-CM

## 2013-09-22 LAB — CBC
HCT: 28.4 % — ABNORMAL LOW (ref 36.0–46.0)
Hemoglobin: 8.8 g/dL — ABNORMAL LOW (ref 12.0–15.0)
MCH: 22.1 pg — AB (ref 26.0–34.0)
MCHC: 31 g/dL (ref 30.0–36.0)
MCV: 71.4 fL — ABNORMAL LOW (ref 78.0–100.0)
PLATELETS: 228 10*3/uL (ref 150–400)
RBC: 3.98 MIL/uL (ref 3.87–5.11)
RDW: 16.2 % — ABNORMAL HIGH (ref 11.5–15.5)
WBC: 6.9 10*3/uL (ref 4.0–10.5)

## 2013-09-22 LAB — OB RESULTS CONSOLE GBS: STREP GROUP B AG: POSITIVE

## 2013-09-22 MED ORDER — LACTATED RINGERS IV SOLN
INTRAVENOUS | Status: DC
  Administered 2013-09-22 – 2013-09-23 (×4): via INTRAVENOUS

## 2013-09-22 MED ORDER — PENICILLIN G POTASSIUM 5000000 UNITS IJ SOLR
5.0000 10*6.[IU] | Freq: Once | INTRAMUSCULAR | Status: AC
  Administered 2013-09-22: 5 10*6.[IU] via INTRAVENOUS
  Filled 2013-09-22: qty 5

## 2013-09-22 MED ORDER — PHENYLEPHRINE 40 MCG/ML (10ML) SYRINGE FOR IV PUSH (FOR BLOOD PRESSURE SUPPORT)
80.0000 ug | PREFILLED_SYRINGE | INTRAVENOUS | Status: DC | PRN
  Filled 2013-09-22: qty 2

## 2013-09-22 MED ORDER — EPHEDRINE 5 MG/ML INJ
10.0000 mg | INTRAVENOUS | Status: DC | PRN
  Filled 2013-09-22: qty 2
  Filled 2013-09-22: qty 4

## 2013-09-22 MED ORDER — FLEET ENEMA 7-19 GM/118ML RE ENEM: 1.0000 | ENEMA | RECTAL | Status: DC | PRN

## 2013-09-22 MED ORDER — FENTANYL 2.5 MCG/ML BUPIVACAINE 1/10 % EPIDURAL INFUSION (WH - ANES)
13.0000 mL/h | INTRAMUSCULAR | Status: DC | PRN
  Filled 2013-09-22: qty 125

## 2013-09-22 MED ORDER — LACTATED RINGERS IV SOLN
500.0000 mL | Freq: Once | INTRAVENOUS | Status: AC
  Administered 2013-09-22: 500 mL via INTRAVENOUS

## 2013-09-22 MED ORDER — EPHEDRINE 5 MG/ML INJ
10.0000 mg | INTRAVENOUS | Status: DC | PRN
  Filled 2013-09-22: qty 2

## 2013-09-22 MED ORDER — ONDANSETRON HCL 4 MG/2ML IJ SOLN: 4.0000 mg | Freq: Four times a day (QID) | INTRAMUSCULAR | Status: DC | PRN

## 2013-09-22 MED ORDER — LACTATED RINGERS IV SOLN: 500.0000 mL | INTRAVENOUS | Status: DC | PRN

## 2013-09-22 MED ORDER — PHENYLEPHRINE 40 MCG/ML (10ML) SYRINGE FOR IV PUSH (FOR BLOOD PRESSURE SUPPORT)
80.0000 ug | PREFILLED_SYRINGE | INTRAVENOUS | Status: DC | PRN
  Filled 2013-09-22: qty 10
  Filled 2013-09-22: qty 2

## 2013-09-22 MED ORDER — PENICILLIN G POTASSIUM 5000000 UNITS IJ SOLR
2.5000 10*6.[IU] | INTRAVENOUS | Status: DC
  Administered 2013-09-23: 2.5 10*6.[IU] via INTRAVENOUS
  Filled 2013-09-22 (×5): qty 2.5

## 2013-09-22 MED ORDER — SODIUM BICARBONATE 8.4 % IV SOLN
INTRAVENOUS | Status: DC | PRN
  Administered 2013-09-22: 5 mL via EPIDURAL

## 2013-09-22 MED ORDER — IBUPROFEN 600 MG PO TABS: 600.0000 mg | ORAL_TABLET | Freq: Four times a day (QID) | ORAL | Status: DC | PRN

## 2013-09-22 MED ORDER — OXYCODONE-ACETAMINOPHEN 5-325 MG PO TABS: 1.0000 | ORAL_TABLET | ORAL | Status: DC | PRN

## 2013-09-22 MED ORDER — LIDOCAINE HCL (PF) 1 % IJ SOLN
30.0000 mL | INTRAMUSCULAR | Status: DC | PRN
  Filled 2013-09-22: qty 30

## 2013-09-22 MED ORDER — CITRIC ACID-SODIUM CITRATE 334-500 MG/5ML PO SOLN: 30.0000 mL | ORAL | Status: DC | PRN

## 2013-09-22 MED ORDER — OXYTOCIN BOLUS FROM INFUSION
500.0000 mL | INTRAVENOUS | Status: DC
  Administered 2013-09-23: 500 mL via INTRAVENOUS

## 2013-09-22 MED ORDER — OXYTOCIN 40 UNITS IN LACTATED RINGERS INFUSION - SIMPLE MED
62.5000 mL/h | INTRAVENOUS | Status: DC
  Administered 2013-09-23: 62.5 mL/h via INTRAVENOUS
  Filled 2013-09-22: qty 1000

## 2013-09-22 MED ORDER — FENTANYL 2.5 MCG/ML BUPIVACAINE 1/10 % EPIDURAL INFUSION (WH - ANES)
14.0000 mL/h | INTRAMUSCULAR | Status: DC | PRN
  Administered 2013-09-22: 14 mL/h via EPIDURAL

## 2013-09-22 MED ORDER — DIPHENHYDRAMINE HCL 50 MG/ML IJ SOLN: 12.5000 mg | INTRAMUSCULAR | Status: DC | PRN

## 2013-09-22 MED ORDER — ACETAMINOPHEN 325 MG PO TABS: 650.0000 mg | ORAL_TABLET | ORAL | Status: DC | PRN

## 2013-09-22 NOTE — MAU Note (Signed)
Pt having contractions every 5 min, G3 P1, uncomfortable with contractions.  Pt registered and to room 1 via wheelchair.

## 2013-09-22 NOTE — Anesthesia Preprocedure Evaluation (Signed)
Anesthesia Evaluation  Patient identified by MRN, date of birth, ID band Patient awake    Reviewed: Allergy & Precautions, H&P , Patient's Chart, lab work & pertinent test results  Airway Mallampati: II TM Distance: >3 FB Neck ROM: full    Dental  (+) Teeth Intact   Pulmonary former smoker,  breath sounds clear to auscultation        Cardiovascular Rhythm:regular Rate:Normal     Neuro/Psych    GI/Hepatic GERD-  ,  Endo/Other    Renal/GU      Musculoskeletal   Abdominal   Peds  Hematology  (+) anemia ,   Anesthesia Other Findings       Reproductive/Obstetrics (+) Pregnancy                           Anesthesia Physical Anesthesia Plan  ASA: II  Anesthesia Plan: Epidural   Post-op Pain Management:    Induction:   Airway Management Planned:   Additional Equipment:   Intra-op Plan:   Post-operative Plan:   Informed Consent: I have reviewed the patients History and Physical, chart, labs and discussed the procedure including the risks, benefits and alternatives for the proposed anesthesia with the patient or authorized representative who has indicated his/her understanding and acceptance.   Dental Advisory Given  Plan Discussed with:   Anesthesia Plan Comments: (Labs checked- platelets confirmed with RN in room. Fetal heart tracing, per RN, reported to be stable enough for sitting procedure. Discussed epidural, and patient consents to the procedure:  included risk of possible headache,backache, failed block, allergic reaction, and nerve injury. This patient was asked if she had any questions or concerns before the procedure started.)        Anesthesia Quick Evaluation

## 2013-09-22 NOTE — H&P (Signed)
Amber Newton is a 28 y.o. female presenting for SOL. Maternal Medical History:  Reason for admission: Contractions.   Contractions: Onset was 3-5 hours ago.   Frequency: regular.   Perceived severity is moderate.    Fetal activity: Perceived fetal activity is normal.   Last perceived fetal movement was within the past hour.      OB History   Grav Para Term Preterm Abortions TAB SAB Ect Mult Living   3 1 1  0 1 0 1 0 0 1     Past Medical History  Diagnosis Date  . Abnormal Pap smear     HPV, colpo  . Infection     UTI  . ZJIRCVEL(381.0)    Past Surgical History  Procedure Laterality Date  . No past surgeries     Family History: family history includes Cancer in her father, maternal aunt, and paternal grandfather; Diabetes in her maternal grandmother and paternal grandmother; Hypertension in her maternal grandmother and mother. Social History:  reports that she has quit smoking. She has never used smokeless tobacco. She reports that she does not drink alcohol or use illicit drugs.   Prenatal Transfer Tool  Maternal Diabetes: No Genetic Screening: Normal Maternal Ultrasounds/Referrals: Normal Fetal Ultrasounds or other Referrals:  None Maternal Substance Abuse:  No Significant Maternal Medications:  None Significant Maternal Lab Results:  None Other Comments:  None  ROS  Dilation: 7 Effacement (%): 90 Station: -3 Exam by:: K. weiss RN Blood pressure 141/95, pulse 88, temperature 97.4 F (36.3 C), temperature source Oral, resp. rate 18, last menstrual period 12/31/2012. Exam Physical Exam  Constitutional: She is oriented to person, place, and time. She appears well-developed and well-nourished.  HENT:  Head: Atraumatic.  Neck: Normal range of motion. Neck supple.  Cardiovascular: Normal rate and regular rhythm.   Respiratory: Effort normal and breath sounds normal.  GI:  Term FH  Genitourinary:  7/vtx  Musculoskeletal: Normal range of motion.  Neurological:  She is alert and oriented to person, place, and time.    Prenatal labs: ABO, Rh: --/--/A POS (03/02 1751) Antibody: NEG (03/02 0525) Rubella: Immune (10/30 0000) RPR: Nonreactive (10/30 0000)  HBsAg: Negative (10/30 0000)  HIV: Non-reactive (10/30 0000)  GBS: Positive (05/25 0000)   Assessment/Plan: Term IUP, labor, +GBS   Meriel Pica 09/22/2013, 9:16 PM

## 2013-09-22 NOTE — Anesthesia Procedure Notes (Signed)

## 2013-09-23 ENCOUNTER — Encounter (HOSPITAL_COMMUNITY): Payer: Self-pay | Admitting: *Deleted

## 2013-09-23 LAB — TYPE AND SCREEN
ABO/RH(D): A POS
Antibody Screen: NEGATIVE

## 2013-09-23 LAB — RPR

## 2013-09-23 MED ORDER — LANOLIN HYDROUS EX OINT: TOPICAL_OINTMENT | CUTANEOUS | Status: DC | PRN

## 2013-09-23 MED ORDER — SENNOSIDES-DOCUSATE SODIUM 8.6-50 MG PO TABS
2.0000 | ORAL_TABLET | ORAL | Status: DC
  Administered 2013-09-24: 2 via ORAL
  Filled 2013-09-23 (×2): qty 2

## 2013-09-23 MED ORDER — ONDANSETRON HCL 4 MG/2ML IJ SOLN: 4.0000 mg | INTRAMUSCULAR | Status: DC | PRN

## 2013-09-23 MED ORDER — SIMETHICONE 80 MG PO CHEW
80.0000 mg | CHEWABLE_TABLET | ORAL | Status: DC | PRN
  Administered 2013-09-23: 80 mg via ORAL

## 2013-09-23 MED ORDER — ZOLPIDEM TARTRATE 5 MG PO TABS: 5.0000 mg | ORAL_TABLET | Freq: Every evening | ORAL | Status: DC | PRN

## 2013-09-23 MED ORDER — IBUPROFEN 800 MG PO TABS
800.0000 mg | ORAL_TABLET | Freq: Three times a day (TID) | ORAL | Status: DC | PRN
Start: 2013-09-23 — End: 2013-09-25
  Administered 2013-09-23 – 2013-09-25 (×5): 800 mg via ORAL
  Filled 2013-09-23 (×6): qty 1

## 2013-09-23 MED ORDER — OXYCODONE-ACETAMINOPHEN 5-325 MG PO TABS
1.0000 | ORAL_TABLET | Freq: Four times a day (QID) | ORAL | Status: DC | PRN
  Administered 2013-09-24 (×2): 1 via ORAL
  Filled 2013-09-23 (×2): qty 1

## 2013-09-23 MED ORDER — PRENATAL MULTIVITAMIN CH
1.0000 | ORAL_TABLET | Freq: Every day | ORAL | Status: DC
  Administered 2013-09-25: 1 via ORAL
  Filled 2013-09-23 (×2): qty 1

## 2013-09-23 MED ORDER — BENZOCAINE-MENTHOL 20-0.5 % EX AERO
1.0000 "application " | INHALATION_SPRAY | CUTANEOUS | Status: DC | PRN
  Administered 2013-09-23: 1 via TOPICAL
  Filled 2013-09-23: qty 56

## 2013-09-23 MED ORDER — DIPHENHYDRAMINE HCL 25 MG PO CAPS: 25.0000 mg | ORAL_CAPSULE | Freq: Four times a day (QID) | ORAL | Status: DC | PRN

## 2013-09-23 MED ORDER — DIBUCAINE 1 % RE OINT: 1.0000 "application " | TOPICAL_OINTMENT | RECTAL | Status: DC | PRN

## 2013-09-23 MED ORDER — ONDANSETRON HCL 4 MG PO TABS
4.0000 mg | ORAL_TABLET | ORAL | Status: DC | PRN
  Administered 2013-09-23: 4 mg via ORAL
  Filled 2013-09-23: qty 1

## 2013-09-23 MED ORDER — TETANUS-DIPHTH-ACELL PERTUSSIS 5-2.5-18.5 LF-MCG/0.5 IM SUSP: 0.5000 mL | Freq: Once | INTRAMUSCULAR | Status: DC

## 2013-09-23 MED ORDER — MEASLES, MUMPS & RUBELLA VAC ~~LOC~~ INJ: 0.5000 mL | INJECTION | Freq: Once | SUBCUTANEOUS | Status: DC

## 2013-09-23 MED ORDER — WITCH HAZEL-GLYCERIN EX PADS: 1.0000 "application " | MEDICATED_PAD | CUTANEOUS | Status: DC | PRN

## 2013-09-23 MED ORDER — FLEET ENEMA 7-19 GM/118ML RE ENEM: 1.0000 | ENEMA | Freq: Every day | RECTAL | Status: DC | PRN

## 2013-09-23 MED ORDER — BISACODYL 10 MG RE SUPP: 10.0000 mg | Freq: Every day | RECTAL | Status: DC | PRN

## 2013-09-23 NOTE — Progress Notes (Signed)
Patient has had some mild discomfort in upper abdomen below breast area when ambulating to the bathroom. Patients fundus firm and is one above umbilicus . Denies SOB or any other complaints besides perineum soreness and cramping when breastfeeding.  When patient in bed she denies having the "pressure" type feeling in upper abdomen area.  Vital signs throughout shift today were: 125/69-79-20-99% and 122/61-80--20-99%. Dr. Vincente Poli called and spoken to over phone and stated no orders given at this time, continue to watch.

## 2013-09-23 NOTE — Progress Notes (Signed)
Post Partum Day 0 Subjective: no complaints, up ad lib, voiding and tolerating PO  Objective: Blood pressure 125/69, pulse 79, temperature 98.4 F (36.9 C), temperature source Oral, resp. rate 20, height 5\' 8"  (1.727 m), weight 208 lb (94.348 kg), last menstrual period 12/31/2012, SpO2 99.00%, unknown if currently breastfeeding.  Physical Exam:  General: alert and cooperative Lochia: appropriate Uterine Fundus: firm Incision: perineum intact DVT Evaluation: No evidence of DVT seen on physical exam. Negative Homan's sign. No cords or calf tenderness. No significant calf/ankle edema.   Recent Labs  09/22/13 2100  HGB 8.8*  HCT 28.4*    Assessment/Plan: Plan for discharge tomorrow   LOS: 1 day   Judith Blonder 09/23/2013, 8:15 AM

## 2013-09-23 NOTE — Anesthesia Postprocedure Evaluation (Signed)
Anesthesia Post Note  Patient: Amber Newton  Procedure(s) Performed: * No procedures listed *  Anesthesia type: Epidural  Patient location: Mother/Baby  Post pain: Pain level controlled  Post assessment: Post-op Vital signs reviewed  Last Vitals:  Filed Vitals:   09/23/13 0630  BP: 139/73  Pulse: 88  Temp: 36.9 C  Resp: 18    Post vital signs: Reviewed  Level of consciousness:alert  Complications: No apparent anesthesia complications

## 2013-09-23 NOTE — Progress Notes (Signed)
UR chart review completed.  

## 2013-09-23 NOTE — Progress Notes (Signed)
I placed a call to Dr. Marcelle Overlie to verify an order for lab for a CBC draw scheduled for this morning.  Lab asked for verification since Mom delivered at 0400 this morning.  I left a call back number for him; as of this time, he has not yet returned the call.

## 2013-09-24 LAB — CBC
HCT: 26.5 % — ABNORMAL LOW (ref 36.0–46.0)
Hemoglobin: 8 g/dL — ABNORMAL LOW (ref 12.0–15.0)
MCH: 21.7 pg — AB (ref 26.0–34.0)
MCHC: 30.2 g/dL (ref 30.0–36.0)
MCV: 71.8 fL — ABNORMAL LOW (ref 78.0–100.0)
PLATELETS: 187 10*3/uL (ref 150–400)
RBC: 3.69 MIL/uL — AB (ref 3.87–5.11)
RDW: 16.4 % — ABNORMAL HIGH (ref 11.5–15.5)
WBC: 8.3 10*3/uL (ref 4.0–10.5)

## 2013-09-24 MED ORDER — FERROUS SULFATE 325 (65 FE) MG PO TABS
325.0000 mg | ORAL_TABLET | Freq: Three times a day (TID) | ORAL | Status: DC
  Administered 2013-09-24 – 2013-09-25 (×3): 325 mg via ORAL
  Filled 2013-09-24 (×3): qty 1

## 2013-09-24 NOTE — Progress Notes (Addendum)
Post Partum Day 1 Subjective: up ad lib, voiding, tolerating PO, + flatus and Denies Ha, blurred vision. Does compain of RUQ pain. Describes as dull achey, Denies any increase in pain with ingestion oif fatty foods. No CP or SOB  Objective: Blood pressure 116/77, pulse 82, temperature 97.2 F (36.2 C), temperature source Oral, resp. rate 16, height 5\' 8"  (1.727 m), weight 208 lb (94.348 kg), last menstrual period 12/31/2012, SpO2 100.00%, unknown if currently breastfeeding.  Physical Exam:  General: alert and cooperative Lochia: appropriate Uterine Fundus: firm Abdomen - soft, NT.  No RUQ tenderness upon palpation Incision: perineum intact DVT Evaluation: No evidence of DVT seen on physical exam. Negative Homan's sign. No cords or calf tenderness. No significant calf/ankle edema. DTR's 2+ no clonus   Recent Labs  09/22/13 2100 09/24/13 0710  HGB 8.8* 8.0*  HCT 28.4* 26.5*    Assessment/Plan: Discharge home tomorrow  FEso4 TID Continue to observe    LOS: 2 days   Judith Blonder 09/24/2013, 8:19 AM

## 2013-09-24 NOTE — Lactation Note (Signed)
This note was copied from the chart of Amber Newton. Lactation Consultation Note Mom c/o sore nipples, stating the baby wasn't opening his mouth wide enough, parents try to gently chin tug. Mom says baby will feed for about 15 min. And then go to sleep. Wasn't unwrapping the baby to feed. Assessing moms breast noted filling tight to Rt. Breast, unable to hand express or compress nipple. Has pitting edema to LE. Ice applied to breast. Shells given to wear in AM. Hand pump demonstrated, Able to hand express Lt. Breast. Mom stated baby had fed 15 min. On that side last. Having difficulty latching on the right. DEBP given and noted nipples to pull out well. Pump 15 min. W/69ml colostrum. Noted softening of breast enough to latch baby to Rt. Breast. Encouraged football hold position, mom stated she had been feeding in that position. STS encouraged. Mom encouraged to feed baby 8-12 times/24 hours and with feeding cues. Mom encouraged to feed baby w/feeding cuesSpecifics of an asymmetric latch shown. Reviewed Baby & Me book's Breastfeeding Basics. Mom shown how to use DEBP & how to disassemble, clean, & reassemble parts. Mom has 3 yr. Old son whom she Pumped and fed for 1 yr. Unable to latch well. Mom would like to be able to BF. Noted the baby latched well after Pre-pumping to soften breast and pull out nipples for a deeper latch. Plan to Pre-pump, wear shell at times prior to feeding ans comfort gels after feeding. Mom was also giving baby a pacifier, which was discouraged and explained why. Mom agreed that she didn't want to risk the baby getting nipple confusion or tiring out and not feeding d/t pacifier. Patient Name: Amber Newton [id number] Date: 09/24/2013 Reason for consult: Breast/nipple pain;Follow-up assessment   Maternal Data    Feeding Feeding Type: Breast Fed Length of feed: 30 min (still feeding)  LATCH Score/Interventions Latch: Grasps breast easily, tongue down, lips flanged, rhythmical  sucking.  Audible Swallowing: Spontaneous and intermittent Intervention(s): Skin to skin;Hand expression;Alternate breast massage  Type of Nipple: Everted at rest and after stimulation  Comfort (Breast/Nipple): Filling, red/small blisters or bruises, mild/mod discomfort  Problem noted: Mild/Moderate discomfort;Filling Interventions (Filling): Massage;Reverse pressure;Frequent nursing;Double electric pump;Hand pump Interventions (Mild/moderate discomfort): Hand massage;Hand expression;Pre-pump if needed;Comfort gels  Hold (Positioning): Assistance needed to correctly position infant at breast and maintain latch. Intervention(s): Breastfeeding basics reviewed;Support Pillows;Position options;Skin to skin  LATCH Score: 8  Lactation Tools Discussed/Used Tools: Shells;Pump;Comfort gels Shell Type: Sore Breast pump type: Double-Electric Breast Pump Pump Review: Setup, frequency, and cleaning Initiated by:: Peri Jefferson RN Date initiated:: 09/24/13   Consult Status Consult Status: Follow-up Date: 09/24/13 Follow-up type: In-patient    Charyl Dancer 09/24/2013, 3:25 AM

## 2013-09-25 MED ORDER — IBUPROFEN 800 MG PO TABS: 800.0000 mg | ORAL_TABLET | Freq: Three times a day (TID) | ORAL | Status: DC | PRN

## 2013-09-25 MED ORDER — OXYCODONE-ACETAMINOPHEN 5-325 MG PO TABS: 1.0000 | ORAL_TABLET | Freq: Four times a day (QID) | ORAL | Status: DC | PRN

## 2013-09-25 NOTE — Lactation Note (Signed)
This note was copied from the chart of Boy Kresta Scaffidi. Lactation Consultation Note     Follow up consult with this mom and baby, now 55 hours post partum. Mom pumped with her first baby, due to painful latch. She has been doing some pumping with DEP with this birth, due to sore nipples. I assisted mom with positioning her and baby, and latching him in cross cradle hold. The baby latched deeply with rhythmic suckles and lots of audible swallows. Mom reports latch omfortable, no pinching. Mom wuold prefer latching and breast feeding to pumping, so was pleased. Breast feeding pages in the baby and Me book b=reviewed with mom. Mom slightly engorged/very full. Softer on left breast, after 45 minute feeding.  I made mom a bra from streth pants, to hold her comfort gels, and got her ice and reviewed breast care. Nipple skin intact, just tender. Suck training done with baby - good suck with normal appearing tongue movements.  Mom knows to call lactation for questions/concerns, o/p consult prn.   Patient Name: Boy Chayla Tilghman [id number] Date: 09/25/2013 Reason for consult: Follow-up assessment   Maternal Data    Feeding Feeding Type: Breast Fed Length of feed: 45 min  LATCH Score/Interventions Latch: Grasps breast easily, tongue down, lips flanged, rhythmical sucking. Intervention(s): Adjust position;Assist with latch;Breast compression  Audible Swallowing: Spontaneous and intermittent  Type of Nipple: Everted at rest and after stimulation  Comfort (Breast/Nipple): Engorged, cracked, bleeding, large blisters, severe discomfort Problem noted: Engorgment Intervention(s): Ice;Hand expression  Problem noted: Mild/Moderate discomfort;Filling Interventions (Mild/moderate discomfort): Comfort gels  Hold (Positioning): Assistance needed to correctly position infant at breast and maintain latch. Intervention(s): Breastfeeding basics reviewed;Support Pillows;Position options;Skin to skin  LATCH Score:  7  Lactation Tools Discussed/Used     Consult Status Consult Status: Complete Follow-up type: Call as needed    Alfred Levins 09/25/2013, 11:20 AM

## 2013-09-25 NOTE — Progress Notes (Signed)
Post Partum Day 2 Subjective: up ad lib, voiding, tolerating PO, + flatus and pain RUQ significantly improved, but not completely resolved. No SOB, Denies HA. Reports baby has not nad BM, Expresses deisre for circ, but prefers to have performed outpatient   Objective: Blood pressure 114/73, pulse 73, temperature 98 F (36.7 C), temperature source Oral, resp. rate 18, height 5\' 8"  (1.727 m), weight 208 lb (94.348 kg), last menstrual period 12/31/2012, SpO2 98.00%, unknown if currently breastfeeding.  Physical Exam:  General: alert and cooperative Lochia: appropriate Uterine Fundus: firm Incision: healing well DVT Evaluation: No evidence of DVT seen on physical exam. Negative Homan's sign. No cords or calf tenderness. Calf/Ankle edema is present.   Recent Labs  09/22/13 2100 09/24/13 0710  HGB 8.8* 8.0*  HCT 28.4* 26.5*    Assessment/Plan: Discharge home   LOS: 3 days   Judith Blonder 09/25/2013, 8:40 AM

## 2013-09-25 NOTE — Discharge Summary (Signed)
Obstetric Discharge Summary Reason for Admission: onset of labor Prenatal Procedures: ultrasound Intrapartum Procedures: spontaneous vaginal delivery Postpartum Procedures: none Complications-Operative and Postpartum: 1 degree perineal laceration Hemoglobin  Date Value Ref Range Status  09/24/2013 8.0* 12.0 - 15.0 g/dL Final     HCT  Date Value Ref Range Status  09/24/2013 26.5* 36.0 - 46.0 % Final    Physical Exam:  General: alert and cooperative Lochia: appropriate Uterine Fundus: firm Incision: perineum intact DVT Evaluation: No evidence of DVT seen on physical exam. Negative Homan's sign. No cords or calf tenderness. Calf/Ankle edema is present.  Discharge Diagnoses: Term Pregnancy-delivered  Discharge Information: Date: 09/25/2013 Activity: pelvic rest Diet: routine Medications: PNV, Ibuprofen and Percocet Condition: stable Instructions: refer to practice specific booklet Discharge to: home   Newborn Data: Live born female  Birth Weight: 8 lb 8.8 oz (3878 g) APGAR: 9, 9  Home with mother.  Judith Blonder 09/25/2013, 8:45 AM

## 2014-03-02 ENCOUNTER — Encounter (HOSPITAL_COMMUNITY): Payer: Self-pay | Admitting: *Deleted

## 2014-04-20 ENCOUNTER — Encounter: Payer: Medicaid Other | Admitting: Family

## 2014-05-01 NOTE — L&D Delivery Note (Cosign Needed)
  Delivery Note At 1:31 AM a viable female was delivered via Vaginal, Spontaneous Delivery (Presentation: Right Occiput Anterior).  APGAR: 8, 9; weight pending .   Placenta status: Intact, Spontaneous.  Cord: 3 vessels with the following complications: Loose nuchal cord x1, easily reduced.  Cord pH: N/A  Anesthesia: Epidural  Episiotomy: None Lacerations:  none Suture Repair: none Est. Blood Loss (mL):  100   Mom to postpartum.  Baby to Couplet care / Skin to Skin.  De Hollingshead 11/19/2014, 2:12 AM   Patient is a [id number] at [redacted]w[redacted]d who was admitted w/ SOL, uncomplicated prenatal course.  She progressed without augmentation.  I was gloved and present for delivery in its entirety.  Second stage of labor progressed to SVD.  Complications: none   SHAW, KIMBERLY, CNM 4:38 AM

## 2014-06-08 ENCOUNTER — Ambulatory Visit (INDEPENDENT_AMBULATORY_CARE_PROVIDER_SITE_OTHER): Payer: Medicaid Other | Admitting: Advanced Practice Midwife

## 2014-06-08 ENCOUNTER — Other Ambulatory Visit: Payer: Self-pay | Admitting: Advanced Practice Midwife

## 2014-06-08 ENCOUNTER — Encounter: Payer: Self-pay | Admitting: *Deleted

## 2014-06-08 ENCOUNTER — Encounter: Payer: Self-pay | Admitting: Advanced Practice Midwife

## 2014-06-08 VITALS — BP 115/71 | HR 83 | Wt 196.0 lb

## 2014-06-08 DIAGNOSIS — Z3492 Encounter for supervision of normal pregnancy, unspecified, second trimester: Secondary | ICD-10-CM

## 2014-06-08 DIAGNOSIS — Z3482 Encounter for supervision of other normal pregnancy, second trimester: Secondary | ICD-10-CM

## 2014-06-08 DIAGNOSIS — O09892 Supervision of other high risk pregnancies, second trimester: Secondary | ICD-10-CM

## 2014-06-08 DIAGNOSIS — O99820 Streptococcus B carrier state complicating pregnancy: Secondary | ICD-10-CM | POA: Insufficient documentation

## 2014-06-08 NOTE — Progress Notes (Signed)
New OB today, but I was unable to do her visit due to sudden illness. Will reschedule

## 2014-06-08 NOTE — Progress Notes (Signed)
Bedside u/s shows single IUP with FHT 153

## 2014-06-09 LAB — SICKLE CELL SCREEN: SICKLE CELL SCREEN: NEGATIVE

## 2014-06-09 LAB — OBSTETRIC PANEL
Antibody Screen: NEGATIVE
Basophils Absolute: 0 10*3/uL (ref 0.0–0.1)
Basophils Relative: 0 % (ref 0–1)
EOS ABS: 0.3 10*3/uL (ref 0.0–0.7)
Eosinophils Relative: 4 % (ref 0–5)
HEMATOCRIT: 35.5 % — AB (ref 36.0–46.0)
Hemoglobin: 11.5 g/dL — ABNORMAL LOW (ref 12.0–15.0)
Hepatitis B Surface Ag: NEGATIVE
LYMPHS PCT: 28 % (ref 12–46)
Lymphs Abs: 1.8 10*3/uL (ref 0.7–4.0)
MCH: 25.1 pg — AB (ref 26.0–34.0)
MCHC: 32.4 g/dL (ref 30.0–36.0)
MCV: 77.5 fL — ABNORMAL LOW (ref 78.0–100.0)
MONOS PCT: 7 % (ref 3–12)
MPV: 10 fL (ref 8.6–12.4)
Monocytes Absolute: 0.4 10*3/uL (ref 0.1–1.0)
NEUTROS ABS: 3.8 10*3/uL (ref 1.7–7.7)
Neutrophils Relative %: 61 % (ref 43–77)
Platelets: 284 10*3/uL (ref 150–400)
RBC: 4.58 MIL/uL (ref 3.87–5.11)
RDW: 15.5 % (ref 11.5–15.5)
Rh Type: POSITIVE
Rubella: 2.24 Index — ABNORMAL HIGH (ref <0.90)
WBC: 6.3 10*3/uL (ref 4.0–10.5)

## 2014-06-09 LAB — GC/CHLAMYDIA PROBE AMP
CT PROBE, AMP APTIMA: NEGATIVE
GC PROBE AMP APTIMA: NEGATIVE

## 2014-06-09 LAB — CULTURE, URINE COMPREHENSIVE
Colony Count: NO GROWTH
Organism ID, Bacteria: NO GROWTH

## 2014-06-09 LAB — HIV ANTIBODY (ROUTINE TESTING W REFLEX): HIV 1&2 Ab, 4th Generation: NONREACTIVE

## 2014-06-11 ENCOUNTER — Other Ambulatory Visit (HOSPITAL_COMMUNITY)
Admission: RE | Admit: 2014-06-11 | Discharge: 2014-06-11 | Disposition: A | Discharge status: Home / Self Care | Payer: Medicaid Other | Source: Ambulatory Visit | Attending: Obstetrics & Gynecology | Admitting: Obstetrics & Gynecology

## 2014-06-11 ENCOUNTER — Encounter: Payer: Self-pay | Admitting: Obstetrics & Gynecology

## 2014-06-11 ENCOUNTER — Ambulatory Visit (INDEPENDENT_AMBULATORY_CARE_PROVIDER_SITE_OTHER): Payer: Medicaid Other | Admitting: Obstetrics & Gynecology

## 2014-06-11 VITALS — BP 123/74 | HR 88 | Wt 193.0 lb

## 2014-06-11 DIAGNOSIS — Z1151 Encounter for screening for human papillomavirus (HPV): Secondary | ICD-10-CM | POA: Insufficient documentation

## 2014-06-11 DIAGNOSIS — Z01419 Encounter for gynecological examination (general) (routine) without abnormal findings: Secondary | ICD-10-CM | POA: Insufficient documentation

## 2014-06-11 DIAGNOSIS — O09892 Supervision of other high risk pregnancies, second trimester: Secondary | ICD-10-CM

## 2014-06-11 LAB — HM PAP SMEAR

## 2014-06-11 NOTE — Progress Notes (Signed)
   Subjective:    Amber Newton is a [id number] being seen today for her first obstetrical visit.  Her obstetrical history is significant for short interval between pregnancies. Patient does intend to breast feed. Pregnancy history fully reviewed.  Patient reports no complaints.  Filed Vitals:   06/11/14 1346  BP: 123/74  Pulse: 88  Weight: 193 lb (87.544 kg)    HISTORY: OB History  Gravida Para Term Preterm AB SAB TAB Ectopic Multiple Living  4 2 2  0 1 1 0 0 0 2    # Outcome Date GA Lbr Len/2nd Weight Sex Delivery Anes PTL Lv  4 Current           3 Term 09/23/13 8745w0d 08:33 / 01:57 8 lb 8.8 oz (3.878 kg) M Vag-Spont EPI  Y  2 SAB           1 Term     M Vag-Spont   Y     Past Medical History  Diagnosis Date  . Abnormal Pap smear     HPV, colpo  . Infection     UTI  . Headache(784.0)   . Vaginal Pap smear, abnormal   . Anemia    Past Surgical History  Procedure Laterality Date  . No past surgeries    . Wisdom tooth extraction     Family History  Problem Relation Age of Onset  . Hypertension Mother   . Cancer Father   . Cancer Maternal Aunt   . Hypertension Maternal Grandmother   . Diabetes Maternal Grandmother   . Diabetes Paternal Grandmother   . Cancer Paternal Grandfather      Exam    Uterus:     Pelvic Exam:    Perineum: No Hemorrhoids   Vulva: normal   Vagina:  normal mucosa   pH:    Cervix: anteverted   Adnexa: normal adnexa   Bony Pelvis: android  System: Breast:  normal appearance, no masses or tenderness   Skin: normal coloration and turgor, no rashes    Neurologic: oriented   Extremities: normal strength, tone, and muscle mass   HEENT PERRLA   Mouth/Teeth mucous membranes moist, pharynx normal without lesions   Neck supple   Cardiovascular: regular rate and rhythm   Respiratory:  appears well, vitals normal, no respiratory distress, acyanotic, normal RR, ear and throat exam is normal, neck free of mass or lymphadenopathy, chest  clear, no wheezing, crepitations, rhonchi, normal symmetric air entry   Abdomen: soft, non-tender; bowel sounds normal; no masses,  no organomegaly   Urinary: urethral meatus normal      Assessment:    Pregnancy: [name] Patient Active Problem List   Diagnosis Date Noted  . Short interval between pregnancies affecting pregnancy in second trimester, antepartum 06/08/2014  . Normal labor 09/22/2013  . Short cervix, antepartum 06/25/2013  . Vaginal bleeding in pregnancy 06/23/2013  . GERD 05/31/2009  . VAGINAL DISCHARGE 05/31/2009  . AMENORRHEA 05/31/2009  . ABDOMINAL PREGNANCY WITH INTRAUTERINE PREGNANCY 05/31/2009  . HEADACHE 05/31/2009        Plan:     Initial labs drawn. Prenatal vitamins. Problem list reviewed and updated. Genetic Screening discussed Quad Screen: ordered.  Ultrasound discussed; fetal survey: ordered.  Follow up in 4 weeks.   [name] 06/11/2014

## 2014-06-11 NOTE — Progress Notes (Signed)
Pt was scheduled for a NOB on Monday but CNM was sick and pt had to reschedule

## 2014-06-11 NOTE — Addendum Note (Signed)
Addended by: Arne ClevelandHUTCHINSON, Daiveon Markman J on: 06/11/2014 02:52 PM   Modules accepted: Orders

## 2014-06-11 NOTE — Addendum Note (Signed)
Addended by: Arne ClevelandHUTCHINSON, Vanderbilt Ranieri J on: 06/11/2014 02:35 PM   Modules accepted: Orders

## 2014-06-12 LAB — AFP, QUAD SCREEN
AFP: 17 ng/mL
Age Alone: 1:954 {titer}
Curr Gest Age: 15.5 wks.days
Down Syndrome Scr Risk Est: 1:6430 {titer}
HCG, Total: 12.64 IU/mL
INH: 134.3 pg/mL
INTERPRETATION-AFP: NEGATIVE
MOM FOR HCG: 0.32
MOM FOR INH: 0.84
MoM for AFP: 0.54
OPEN SPINA BIFIDA: NEGATIVE
Tri 18 Scr Risk Est: NEGATIVE
Trisomy 18 (Edward) Syndrome Interp.: 1:933 {titer}
UE3 MOM: 0.77
UE3 VALUE: 0.56 ng/mL

## 2014-06-15 LAB — CYTOLOGY - PAP

## 2014-07-02 ENCOUNTER — Ambulatory Visit (HOSPITAL_COMMUNITY)
Admission: RE | Admit: 2014-07-02 | Discharge: 2014-07-02 | Disposition: A | Discharge status: Home / Self Care | Payer: Medicaid Other | Source: Ambulatory Visit | Attending: Obstetrics & Gynecology | Admitting: Obstetrics & Gynecology

## 2014-07-02 ENCOUNTER — Other Ambulatory Visit: Payer: Self-pay | Admitting: Obstetrics & Gynecology

## 2014-07-02 DIAGNOSIS — Z3A19 19 weeks gestation of pregnancy: Secondary | ICD-10-CM | POA: Insufficient documentation

## 2014-07-02 DIAGNOSIS — O09892 Supervision of other high risk pregnancies, second trimester: Secondary | ICD-10-CM

## 2014-07-02 DIAGNOSIS — Z36 Encounter for antenatal screening of mother: Secondary | ICD-10-CM | POA: Diagnosis present

## 2014-07-02 DIAGNOSIS — Z3689 Encounter for other specified antenatal screening: Secondary | ICD-10-CM | POA: Insufficient documentation

## 2014-07-10 ENCOUNTER — Encounter: Payer: Self-pay | Admitting: Advanced Practice Midwife

## 2014-07-10 ENCOUNTER — Ambulatory Visit (INDEPENDENT_AMBULATORY_CARE_PROVIDER_SITE_OTHER): Payer: Medicaid Other | Admitting: Advanced Practice Midwife

## 2014-07-10 ENCOUNTER — Encounter: Payer: Self-pay | Admitting: *Deleted

## 2014-07-10 VITALS — BP 119/67 | HR 81 | Wt 195.0 lb

## 2014-07-10 DIAGNOSIS — O09892 Supervision of other high risk pregnancies, second trimester: Secondary | ICD-10-CM

## 2014-07-10 DIAGNOSIS — IMO0002 Reserved for concepts with insufficient information to code with codable children: Secondary | ICD-10-CM

## 2014-07-10 DIAGNOSIS — Z3492 Encounter for supervision of normal pregnancy, unspecified, second trimester: Secondary | ICD-10-CM

## 2014-07-10 DIAGNOSIS — Z0489 Encounter for examination and observation for other specified reasons: Secondary | ICD-10-CM

## 2014-07-10 DIAGNOSIS — Z36 Encounter for antenatal screening of mother: Secondary | ICD-10-CM

## 2014-07-10 DIAGNOSIS — Z3482 Encounter for supervision of other normal pregnancy, second trimester: Secondary | ICD-10-CM

## 2014-07-10 NOTE — Patient Instructions (Signed)
Postpartum Tubal Ligation A postpartum tubal ligation (PPTL) is a procedure that blocks the fallopian tubes right after childbirth or 1-2 days after childbirth. PPTL is done before the uterus returns to its normal location. The procedure is also called a minilaparotomy. By blocking the fallopian tubes, the eggs that are released from the ovaries cannot enter the uterus and sperm cannot reach the egg. A PPTL is done so you will not be able to get pregnant or have a baby.  Although this procedure may be reversed, it should be considered permanent and irreversible. If you want to have future pregnancies, you should not have this procedure. LET YOUR CAREGIVER KNOW ABOUT:  Allergies to food or medicine.  Medicines taken, including vitamins, herbs, eyedrops, over-the-counter medicines, and creams.  Use of steroids (by mouth or creams).  Previous problems with numbing medicines.  History of bleeding problems or blood clots.  Any recent colds or infections.  Previous surgery.  Other health problems, including diabetes and kidney problems. RISKS AND COMPLICATIONS  Infection.  Bleeding.  Injury to other organs.  Anesthetic side effects.  Failure of the procedure.  Ectopic pregnancy.  Future regret about having the procedure done. BEFORE THE PROCEDURE   You may need to sign certain permission forms with your insurance up to 30 days before your due date.  After delivering your baby, you cannot eat or drink anything if the procedure is performed the same day. If you are having the procedure a day after delivering, you may be able to eat and drink until midnight. Your caregiver will give you specific directions depending on your situation. PROCEDURE   If done 1-2 days after delivery:  You will be given a medicine to make you sleep (general anesthetic) during the procedure.  A tube will be put down your throat to help your breath while under general anesthesia.  A small cut  (incision) is made just beneath the belly button.  The fallopian tubes are brought up through the incision.  The fallopian tubes are then sealed, tied, or cut.  If done after a caesarean delivery:  Tubal ligation is done through the incision already made (after the baby is delivered).  Once the tubes are blocked, the incision is closed with stitches (sutures). A bandage will be placed over the incisions. AFTER THE PROCEDURE  You may have some pain or cramps in the abdominal area for the next 3-7 days.  You will be given pain medicine to ease any discomfort.  You may also feel sick to your stomach if you were given a general anesthetic.  You may have some mild discomfort in the throat. This is from the tube that may have been placed in your throat while you were sleeping.  You may feel tired and should rest the remainder of the day. Document Released: 04/17/2005 Document Revised: 10/17/2011 Document Reviewed: 07/29/2011 [facility] Patient Information 2015 [place], [place]. This information is not intended to replace advice given to you by your health care provider. Make sure you discuss any questions you have with your health care provider.  Levonorgestrel intrauterine device (IUD) What is this medicine? LEVONORGESTREL IUD (LEE voe nor jes trel) is a contraceptive (birth control) device. The device is placed inside the uterus by a healthcare professional. It is used to prevent pregnancy and can also be used to treat heavy bleeding that occurs during your period. Depending on the device, it can be used for 3 to 5 years. This medicine may be used for other purposes;  ask your health care provider or pharmacist if you have questions. COMMON BRAND NAME(S): Elveria Royals What should I tell my health care provider before I take this medicine? They need to know if you have any of these conditions: -abnormal Pap smear -cancer of the breast, uterus, or  cervix -diabetes -endometritis -genital or pelvic infection now or in the past -have more than one sexual partner or your partner has more than one partner -heart disease -history of an ectopic or tubal pregnancy -immune system problems -IUD in place -liver disease or tumor -problems with blood clots or take blood-thinners -use intravenous drugs -uterus of unusual shape -vaginal bleeding that has not been explained -an unusual or allergic reaction to levonorgestrel, other hormones, silicone, or polyethylene, medicines, foods, dyes, or preservatives -pregnant or trying to get pregnant -breast-feeding How should I use this medicine? This device is placed inside the uterus by a health care professional. Talk to your pediatrician regarding the use of this medicine in children. Special care may be needed. Overdosage: If you think you have taken too much of this medicine contact a poison control center or emergency room at once. NOTE: This medicine is only for you. Do not share this medicine with others. What if I miss a dose? This does not apply. What may interact with this medicine? Do not take this medicine with any of the following medications: -amprenavir -bosentan -fosamprenavir This medicine may also interact with the following medications: -aprepitant -barbiturate medicines for inducing sleep or treating seizures -bexarotene -griseofulvin -medicines to treat seizures like carbamazepine, ethotoin, felbamate, oxcarbazepine, phenytoin, topiramate -modafinil -pioglitazone -rifabutin -rifampin -rifapentine -some medicines to treat HIV infection like atazanavir, indinavir, lopinavir, nelfinavir, tipranavir, ritonavir -St. John's wort -warfarin This list may not describe all possible interactions. Give your health care provider a list of all the medicines, herbs, non-prescription drugs, or dietary supplements you use. Also tell them if you smoke, drink alcohol, or use illegal  drugs. Some items may interact with your medicine. What should I watch for while using this medicine? Visit your doctor or health care professional for regular check ups. See your doctor if you or your partner has sexual contact with others, becomes HIV positive, or gets a sexual transmitted disease. This product does not protect you against HIV infection (AIDS) or other sexually transmitted diseases. You can check the placement of the IUD yourself by reaching up to the top of your vagina with clean fingers to feel the threads. Do not pull on the threads. It is a good habit to check placement after each menstrual period. Call your doctor right away if you feel more of the IUD than just the threads or if you cannot feel the threads at all. The IUD may come out by itself. You may become pregnant if the device comes out. If you notice that the IUD has come out use a backup birth control method like condoms and call your health care provider. Using tampons will not change the position of the IUD and are okay to use during your period. What side effects may I notice from receiving this medicine? Side effects that you should report to your doctor or health care professional as soon as possible: -allergic reactions like skin rash, itching or hives, swelling of the face, lips, or tongue -fever, flu-like symptoms -genital sores -high blood pressure -no menstrual period for 6 weeks during use -pain, swelling, warmth in the leg -pelvic pain or tenderness -severe or sudden headache -signs of pregnancy -stomach  cramping -sudden shortness of breath -trouble with balance, talking, or walking -unusual vaginal bleeding, discharge -yellowing of the eyes or skin Side effects that usually do not require medical attention (report to your doctor or health care professional if they continue or are bothersome): -acne -breast pain -change in sex drive or performance -changes in weight -cramping, dizziness, or  faintness while the device is being inserted -headache -irregular menstrual bleeding within first 3 to 6 months of use -nausea This list may not describe all possible side effects. Call your doctor for medical advice about side effects. You may report side effects to FDA at 1-800-FDA-1088. Where should I keep my medicine? This does not apply. NOTE: This sheet is a summary. It may not cover all possible information. If you have questions about this medicine, talk to your doctor, pharmacist, or health care provider.  2015, [employer] Standard. (2011-05-18 13:54:04)

## 2014-07-10 NOTE — Progress Notes (Signed)
Anatomy scan reviewed, incomplete. F/U ordered. Reviewed NOB labs. Considering BTL vs LARC. Handouts given.

## 2014-07-17 ENCOUNTER — Ambulatory Visit (HOSPITAL_COMMUNITY)
Admission: RE | Admit: 2014-07-17 | Discharge: 2014-07-17 | Disposition: A | Discharge status: Home / Self Care | Payer: Medicaid Other | Source: Ambulatory Visit | Attending: Advanced Practice Midwife | Admitting: Advanced Practice Midwife

## 2014-07-17 DIAGNOSIS — Z36 Encounter for antenatal screening of mother: Secondary | ICD-10-CM | POA: Diagnosis present

## 2014-07-17 DIAGNOSIS — Z3A21 21 weeks gestation of pregnancy: Secondary | ICD-10-CM | POA: Diagnosis not present

## 2014-07-17 DIAGNOSIS — Z0489 Encounter for examination and observation for other specified reasons: Secondary | ICD-10-CM

## 2014-07-17 DIAGNOSIS — IMO0002 Reserved for concepts with insufficient information to code with codable children: Secondary | ICD-10-CM | POA: Insufficient documentation

## 2014-08-07 ENCOUNTER — Ambulatory Visit (INDEPENDENT_AMBULATORY_CARE_PROVIDER_SITE_OTHER): Payer: Medicaid Other | Admitting: Advanced Practice Midwife

## 2014-08-07 VITALS — BP 109/73 | HR 81 | Wt 195.0 lb

## 2014-08-07 DIAGNOSIS — Z3482 Encounter for supervision of other normal pregnancy, second trimester: Secondary | ICD-10-CM

## 2014-08-07 DIAGNOSIS — Z3492 Encounter for supervision of normal pregnancy, unspecified, second trimester: Secondary | ICD-10-CM

## 2014-08-07 NOTE — Patient Instructions (Addendum)
Over-the-counter cortisone cream and a heavy moisturizer (Eucerine, Cocoa Butter)  Tdap Vaccine (Tetanus, Diphtheria, Pertussis): What You Need to Know 1. Why get vaccinated? Tetanus, diphtheria and pertussis can be very serious diseases, even for adolescents and adults. Tdap vaccine can protect us from these diseases. TETANUS (Lockjaw) causes painful muscle tightening and stiffness, usually all over the body.  It can lead to tightening of muscles in the head and neck so you can't open your mouth, swallow, or sometimes even breathe. Tetanus kills about 1 out of 5 people who are infected. DIPHTHERIA can cause a thick coating to form in the back of the throat.  It can lead to breathing problems, paralysis, heart failure, and death. PERTUSSIS (Whooping Cough) causes severe coughing spells, which can cause difficulty breathing, vomiting and disturbed sleep.  It can also lead to weight loss, incontinence, and rib fractures. Up to 2 in 100 adolescents and 5 in 100 adults with pertussis are hospitalized or have complications, which could include pneumonia or death. These diseases are caused by bacteria. Diphtheria and pertussis are spread from person to person through coughing or sneezing. Tetanus enters the body through cuts, scratches, or wounds. Before vaccines, the [place] saw as many as 200,000 cases a year of diphtheria and pertussis, and hundreds of cases of tetanus. Since vaccination began, tetanus and diphtheria have dropped by about 99% and pertussis by about 80%. 2. Tdap vaccine Tdap vaccine can protect adolescents and adults from tetanus, diphtheria, and pertussis. One dose of Tdap is routinely given at age 27 or 5812. People who did not get Tdap at that age should get it as soon as possible. Tdap is especially important for health care professionals and anyone having close contact with a baby younger than 12 months. Pregnant women should get a dose of Tdap during every pregnancy, to  protect the newborn from pertussis. Infants are most at risk for severe, life-threatening complications from pertussis. A similar vaccine, called Td, protects from tetanus and diphtheria, but not pertussis. A Td booster should be given every 10 years. Tdap may be given as one of these boosters if you have not already gotten a dose. Tdap may also be given after a severe cut or burn to prevent tetanus infection. Your doctor can give you more information. Tdap may safely be given at the same time as other vaccines. 3. Some people should not get this vaccine  If you ever had a life-threatening allergic reaction after a dose of any tetanus, diphtheria, or pertussis containing vaccine, OR if you have a severe allergy to any part of this vaccine, you should not get Tdap. Tell your doctor if you have any severe allergies.  If you had a coma, or long or multiple seizures within 7 days after a childhood dose of DTP or DTaP, you should not get Tdap, unless a cause other than the vaccine was found. You can still get Td.  Talk to your doctor if you:  have epilepsy or another nervous system problem,  had severe pain or swelling after any vaccine containing diphtheria, tetanus or pertussis,  ever had Guillain-Barr Syndrome (GBS),  aren't feeling well on the day the shot is scheduled. 4. Risks of a vaccine reaction With any medicine, including vaccines, there is a chance of side effects. These are usually mild and go away on their own, but serious reactions are also possible. Brief fainting spells can follow a vaccination, leading to injuries from falling. Sitting or lying down for about 15  minutes can help prevent these. Tell your doctor if you feel dizzy or light-headed, or have vision changes or ringing in the ears. Mild problems following Tdap (Did not interfere with activities)  Pain where the shot was given (about 3 in 4 adolescents or 2 in 3 adults)  Redness or swelling where the shot was given  (about 1 person in 5)  Mild fever of at least 100.81F (up to about 1 in 25 adolescents or 1 in 100 adults)  Headache (about 3 or 4 people in 10)  Tiredness (about 1 person in 3 or 4)  Nausea, vomiting, diarrhea, stomach ache (up to 1 in 4 adolescents or 1 in 10 adults)  Chills, body aches, sore joints, rash, swollen glands (uncommon) Moderate problems following Tdap (Interfered with activities, but did not require medical attention)  Pain where the shot was given (about 1 in 5 adolescents or 1 in 100 adults)  Redness or swelling where the shot was given (up to about 1 in 16 adolescents or 1 in 25 adults)  Fever over 102F (about 1 in 100 adolescents or 1 in 250 adults)  Headache (about 3 in 20 adolescents or 1 in 10 adults)  Nausea, vomiting, diarrhea, stomach ache (up to 1 or 3 people in 100)  Swelling of the entire arm where the shot was given (up to about 3 in 100). Severe problems following Tdap (Unable to perform usual activities; required medical attention)  Swelling, severe pain, bleeding and redness in the arm where the shot was given (rare). A severe allergic reaction could occur after any vaccine (estimated less than 1 in a million doses). 5. What if there is a serious reaction? What should I look for?  Look for anything that concerns you, such as signs of a severe allergic reaction, very high fever, or behavior changes. Signs of a severe allergic reaction can include hives, swelling of the face and throat, difficulty breathing, a fast heartbeat, dizziness, and weakness. These would start a few minutes to a few hours after the vaccination. What should I do?  If you think it is a severe allergic reaction or other emergency that can't wait, call 9-1-1 or get the person to the nearest hospital. Otherwise, call your doctor.  Afterward, the reaction should be reported to the "Vaccine Adverse Event Reporting System" (VAERS). Your doctor might file this report, or you can do  it yourself through the VAERS web site at www.vaers.LAgents.no, or by calling 1-5630292027. VAERS is only for reporting reactions. They do not give medical advice.  6. The National Vaccine Injury Compensation Program The [employer] Vaccine Injury Compensation Program (VICP) is a federal program that was created to compensate people who may have been injured by certain vaccines. Persons who believe they may have been injured by a vaccine can learn about the program and about filing a claim by calling 1-269-776-8812 or visiting the VICP website at SpiritualWord.at. 7. How can I learn more?  Ask your doctor.  Call your local or state health department.  Contact the Centers for Disease Control and Prevention (CDC):  Call 579-407-1871 or visit CDC's website at PicCapture.uy. CDC Tdap Vaccine VIS (09/07/11) Document Released: 10/17/2011 Document Revised: 09/01/2013 Document Reviewed: 07/30/2013 ExitCare Patient Information 2015 [place], [place]. This information is not intended to replace advice given to you by your health care provider. Make sure you discuss any questions you have with your health care provider.

## 2014-08-07 NOTE — Progress Notes (Signed)
Doing well. GTT at [facility].

## 2014-09-04 ENCOUNTER — Ambulatory Visit (INDEPENDENT_AMBULATORY_CARE_PROVIDER_SITE_OTHER): Payer: Medicaid Other | Admitting: Advanced Practice Midwife

## 2014-09-04 VITALS — BP 117/68 | HR 94 | Wt 201.0 lb

## 2014-09-04 DIAGNOSIS — Z3493 Encounter for supervision of normal pregnancy, unspecified, third trimester: Secondary | ICD-10-CM | POA: Diagnosis not present

## 2014-09-04 LAB — CBC
HEMATOCRIT: 30.6 % — AB (ref 36.0–46.0)
HEMOGLOBIN: 9.8 g/dL — AB (ref 12.0–15.0)
MCH: 23.7 pg — AB (ref 26.0–34.0)
MCHC: 32 g/dL (ref 30.0–36.0)
MCV: 73.9 fL — ABNORMAL LOW (ref 78.0–100.0)
MPV: 10 fL (ref 8.6–12.4)
Platelets: 260 10*3/uL (ref 150–400)
RBC: 4.14 MIL/uL (ref 3.87–5.11)
RDW: 14.9 % (ref 11.5–15.5)
WBC: 7.9 10*3/uL (ref 4.0–10.5)

## 2014-09-04 NOTE — Progress Notes (Signed)
Doing well.  Good fetal movement, denies vaginal bleeding, LOF, regular contractions. Does report irregular braxton-hicks, sometimes 4-5x/hour but not painful. Encourage increase in PO fluids.  PTL precautions given.

## 2014-09-05 LAB — HIV ANTIBODY (ROUTINE TESTING W REFLEX): HIV 1&2 Ab, 4th Generation: NONREACTIVE

## 2014-09-05 LAB — GLUCOSE TOLERANCE, 1 HOUR (50G) W/O FASTING: Glucose, 1 Hour GTT: 88 mg/dL (ref 70–140)

## 2014-09-05 LAB — RPR

## 2014-09-08 ENCOUNTER — Telehealth: Payer: Self-pay | Admitting: *Deleted

## 2014-09-08 NOTE — Telephone Encounter (Signed)
Called pt to adv be sure to take PNV and OTC iron 325mg  daily. LMOM for pt to rtn call if needed.

## 2014-09-08 NOTE — Telephone Encounter (Signed)
-----   Message from Hurshel PartyLisa A Leftwich-Kirby, CNM sent at 09/05/2014  1:29 PM EDT ----- Regarding: Pt with anemia Contact: [phone] I saw this pt on Friday and she had 28 week labs. Hgb dropped from 11 to 9.8 since early pregnancy.  Can you call her to make sure she is taking PNV and have her start taking iron?  One OTC tablet/day should be good.  Thanks so much.

## 2014-09-18 ENCOUNTER — Ambulatory Visit (INDEPENDENT_AMBULATORY_CARE_PROVIDER_SITE_OTHER): Payer: Medicaid Other | Admitting: Certified Nurse Midwife

## 2014-09-18 VITALS — BP 119/70 | HR 92 | Wt 201.0 lb

## 2014-09-18 DIAGNOSIS — Z3493 Encounter for supervision of normal pregnancy, unspecified, third trimester: Secondary | ICD-10-CM

## 2014-09-18 MED ORDER — DOCUSATE SODIUM 100 MG PO CAPS: 100.0000 mg | ORAL_CAPSULE | Freq: Two times a day (BID) | ORAL | Status: DC | PRN

## 2014-09-18 MED ORDER — RANITIDINE HCL 150 MG PO TABS: 150.0000 mg | ORAL_TABLET | Freq: Two times a day (BID) | ORAL | Status: DC

## 2014-09-18 NOTE — Patient Instructions (Signed)
Third Trimester of Pregnancy The third trimester is from week 29 through week 42, months 7 through 9. The third trimester is a time when the fetus is growing rapidly. At the end of the ninth month, the fetus is about 20 inches in length and weighs 6-10 pounds.  BODY CHANGES Your body goes through many changes during pregnancy. The changes vary from woman to woman.   Your weight will continue to increase. You can expect to gain 25-35 pounds (11-16 kg) by the end of the pregnancy.  You may begin to get stretch marks on your hips, abdomen, and breasts.  You may urinate more often because the fetus is moving lower into your pelvis and pressing on your bladder.  You may develop or continue to have heartburn as a result of your pregnancy.  You may develop constipation because certain hormones are causing the muscles that push waste through your intestines to slow down.  You may develop hemorrhoids or swollen, bulging veins (varicose veins).  You may have pelvic pain because of the weight gain and pregnancy hormones relaxing your joints between the bones in your pelvis. Backaches may result from overexertion of the muscles supporting your posture.  You may have changes in your hair. These can include thickening of your hair, rapid growth, and changes in texture. Some women also have hair loss during or after pregnancy, or hair that feels dry or thin. Your hair will most likely return to normal after your baby is born.  Your breasts will continue to grow and be tender. A yellow discharge may leak from your breasts called colostrum.  Your belly button may stick out.  You may feel short of breath because of your expanding uterus.  You may notice the fetus "dropping," or moving lower in your abdomen.  You may have a bloody mucus discharge. This usually occurs a few days to a week before labor begins.  Your cervix becomes thin and soft (effaced) near your due date. WHAT TO EXPECT AT YOUR PRENATAL  EXAMS  You will have prenatal exams every 2 weeks until week 36. Then, you will have weekly prenatal exams. During a routine prenatal visit:  You will be weighed to make sure you and the fetus are growing normally.  Your blood pressure is taken.  Your abdomen will be measured to track your baby's growth.  The fetal heartbeat will be listened to.  Any test results from the previous visit will be discussed.  You may have a cervical check near your due date to see if you have effaced. At around 36 weeks, your caregiver will check your cervix. At the same time, your caregiver will also perform a test on the secretions of the vaginal tissue. This test is to determine if a type of bacteria, Group B streptococcus, is present. Your caregiver will explain this further. Your caregiver may ask you:  What your birth plan is.  How you are feeling.  If you are feeling the baby move.  If you have had any abnormal symptoms, such as leaking fluid, bleeding, severe headaches, or abdominal cramping.  If you have any questions. Other tests or screenings that may be performed during your third trimester include:  Blood tests that check for low iron levels (anemia).  Fetal testing to check the health, activity level, and growth of the fetus. Testing is done if you have certain medical conditions or if there are problems during the pregnancy. FALSE LABOR You may feel small, irregular contractions that   eventually go away. These are called Braxton Hicks contractions, or false labor. Contractions may last for hours, days, or even weeks before true labor sets in. If contractions come at regular intervals, intensify, or become painful, it is best to be seen by your caregiver.  SIGNS OF LABOR   Menstrual-like cramps.  Contractions that are 5 minutes apart or less.  Contractions that start on the top of the uterus and spread down to the lower abdomen and back.  A sense of increased pelvic pressure or back  pain.  A watery or bloody mucus discharge that comes from the vagina. If you have any of these signs before the 37th week of pregnancy, call your caregiver right away. You need to go to the hospital to get checked immediately. HOME CARE INSTRUCTIONS   Avoid all smoking, herbs, alcohol, and unprescribed drugs. These chemicals affect the formation and growth of the baby.  Follow your caregiver's instructions regarding medicine use. There are medicines that are either safe or unsafe to take during pregnancy.  Exercise only as directed by your caregiver. Experiencing uterine cramps is a good sign to stop exercising.  Continue to eat regular, healthy meals.  Wear a good support bra for breast tenderness.  Do not use hot tubs, steam rooms, or saunas.  Wear your seat belt at all times when driving.  Avoid raw meat, uncooked cheese, cat litter boxes, and soil used by cats. These carry germs that can cause birth defects in the baby.  Take your prenatal vitamins.  Try taking a stool softener (if your caregiver approves) if you develop constipation. Eat more high-fiber foods, such as fresh vegetables or fruit and whole grains. Drink plenty of fluids to keep your urine clear or pale yellow.  Take warm sitz baths to soothe any pain or discomfort caused by hemorrhoids. Use hemorrhoid cream if your caregiver approves.  If you develop varicose veins, wear support hose. Elevate your feet for 15 minutes, 3-4 times a day. Limit salt in your diet.  Avoid heavy lifting, wear low heal shoes, and practice good posture.  Rest a lot with your legs elevated if you have leg cramps or low back pain.  Visit your dentist if you have not gone during your pregnancy. Use a soft toothbrush to brush your teeth and be gentle when you floss.  A sexual relationship may be continued unless your caregiver directs you otherwise.  Do not travel far distances unless it is absolutely necessary and only with the approval  of your caregiver.  Take prenatal classes to understand, practice, and ask questions about the labor and delivery.  Make a trial run to the hospital.  Pack your hospital bag.  Prepare the baby's nursery.  Continue to go to all your prenatal visits as directed by your caregiver. SEEK MEDICAL CARE IF:  You are unsure if you are in labor or if your water has broken.  You have dizziness.  You have mild pelvic cramps, pelvic pressure, or nagging pain in your abdominal area.  You have persistent nausea, vomiting, or diarrhea.  You have a bad smelling vaginal discharge.  You have pain with urination. SEEK IMMEDIATE MEDICAL CARE IF:   You have a fever.  You are leaking fluid from your vagina.  You have spotting or bleeding from your vagina.  You have severe abdominal cramping or pain.  You have rapid weight loss or gain.  You have shortness of breath with chest pain.  You notice sudden or extreme swelling   of your face, hands, ankles, feet, or legs.  You have not felt your baby move in over an hour.  You have severe headaches that do not go away with medicine.  You have vision changes. Document Released: 04/11/2001 Document Revised: 04/22/2013 Document Reviewed: 06/18/2012 ExitCare Patient Information 2015 [employer]. This information is not intended to replace advice given to you by your health care provider. Make sure you discuss any questions you have with your health care provider.  

## 2014-09-18 NOTE — Progress Notes (Signed)
Pt c/o heartburn- Zantac called in. Constipation- advised colace. Otherwise pt doing well

## 2014-09-30 ENCOUNTER — Ambulatory Visit (INDEPENDENT_AMBULATORY_CARE_PROVIDER_SITE_OTHER): Payer: Medicaid Other | Admitting: Obstetrics & Gynecology

## 2014-09-30 VITALS — BP 118/69 | HR 103 | Wt 209.0 lb

## 2014-09-30 DIAGNOSIS — O99013 Anemia complicating pregnancy, third trimester: Secondary | ICD-10-CM

## 2014-09-30 DIAGNOSIS — O09892 Supervision of other high risk pregnancies, second trimester: Secondary | ICD-10-CM

## 2014-09-30 DIAGNOSIS — D649 Anemia, unspecified: Secondary | ICD-10-CM

## 2014-09-30 MED ORDER — FERROUS SULFATE 325 (65 FE) MG PO TABS: 325.0000 mg | ORAL_TABLET | Freq: Every day | ORAL | Status: DC

## 2014-09-30 NOTE — Progress Notes (Signed)
Pt declines tdap at this time.   Patinet anemic and taking iron once a day.  Will increase to twice a day.  Check cbc in one month.  If still low, then IV iron.  Patient dropped to hgb 7 with last deliviery. Patient c/o contractions and pressure.  No LOF, VB.  Cervix -- closed, long, posterior, tone

## 2014-10-14 ENCOUNTER — Ambulatory Visit (INDEPENDENT_AMBULATORY_CARE_PROVIDER_SITE_OTHER): Payer: Medicaid Other | Admitting: Obstetrics & Gynecology

## 2014-10-14 VITALS — BP 127/69 | HR 97 | Wt 209.0 lb

## 2014-10-14 DIAGNOSIS — O09892 Supervision of other high risk pregnancies, second trimester: Secondary | ICD-10-CM

## 2014-10-14 NOTE — Progress Notes (Signed)
Routine visit. Good FM. No problems. No more constipation with Dulcolax. Cervical cultures at next visit.

## 2014-10-28 ENCOUNTER — Ambulatory Visit (INDEPENDENT_AMBULATORY_CARE_PROVIDER_SITE_OTHER): Payer: Medicaid Other | Admitting: Obstetrics & Gynecology

## 2014-10-28 VITALS — BP 112/70 | HR 106 | Wt 214.0 lb

## 2014-10-28 DIAGNOSIS — Z3493 Encounter for supervision of normal pregnancy, unspecified, third trimester: Secondary | ICD-10-CM

## 2014-10-28 DIAGNOSIS — Z3483 Encounter for supervision of other normal pregnancy, third trimester: Secondary | ICD-10-CM | POA: Diagnosis not present

## 2014-10-28 LAB — OB RESULTS CONSOLE GBS: STREP GROUP B AG: POSITIVE

## 2014-10-28 LAB — OB RESULTS CONSOLE GC/CHLAMYDIA
Chlamydia: NEGATIVE
GC PROBE AMP, GENITAL: NEGATIVE

## 2014-10-28 NOTE — Progress Notes (Signed)
Subjective:  Amber Newton is a 29 y.o. [id number] at 8637w0d being seen today for ongoing prenatal care.  Patient reports contractions since yesterday.  Contractions: Irregular.  Vag. Bleeding: None. Movement: Present. Denies leaking of fluid.   The following portions of the patient's history were reviewed and updated as appropriate: allergies, current medications, past family history, past medical history, past social history, past surgical history and problem list.   Objective:   Filed Vitals:   10/28/14 0841  BP: 112/70  Pulse: 106  Weight: 214 lb (97.07 kg)    Fetal Status: Fetal Heart Rate (bpm): 155 lb  Fundal Height: 38 cm Movement: Present     General:  Alert, oriented and cooperative. Patient is in no acute distress.  Skin: Skin is warm and dry. No rash noted.   Cardiovascular: Normal heart rate noted  Respiratory: Normal respiratory effort, no problems with respiration noted  Abdomen: Soft, gravid, appropriate for gestational age. Pain/Pressure: Present     Vaginal: Vag. Bleeding: None.    Vag D/C Character: Thin  Cervix: Exam revealed      1/50/-3--posterior and tone  Extremities: Normal range of motion.  Edema: None  Mental Status: Normal mood and affect. Normal behavior. Normal judgment and thought content.   Urinalysis: Urine Protein: 1+ Urine Glucose: Negative  Assessment and Plan:  Pregnancy: [name] at 2737w0d--small cervical change.  1. Prenatal care in third trimester GBS, cultures and cbc today. - GC/chlamydia probe amp, urine - CBC w/Diff - Culture, beta strep (group b only)   Term labor symptoms and general obstetric precautions including but not limited to vaginal bleeding, contractions, leaking of fluid and fetal movement were reviewed in detail with the patient.  Please refer to After Visit Summary for other counseling recommendations.   Return in about 1 week (around 11/04/2014).   Amber DukesKelly H Fradel Baldonado, MD

## 2014-10-29 LAB — CBC WITH DIFFERENTIAL/PLATELET
Basophils Absolute: 0 10*3/uL (ref 0.0–0.1)
Basophils Relative: 0 % (ref 0–1)
EOS ABS: 0.2 10*3/uL (ref 0.0–0.7)
EOS PCT: 2 % (ref 0–5)
HCT: 32.4 % — ABNORMAL LOW (ref 36.0–46.0)
Hemoglobin: 10 g/dL — ABNORMAL LOW (ref 12.0–15.0)
Lymphocytes Relative: 29 % (ref 12–46)
Lymphs Abs: 2.2 10*3/uL (ref 0.7–4.0)
MCH: 21.9 pg — AB (ref 26.0–34.0)
MCHC: 30.9 g/dL (ref 30.0–36.0)
MCV: 71.1 fL — ABNORMAL LOW (ref 78.0–100.0)
MONO ABS: 0.8 10*3/uL (ref 0.1–1.0)
MONOS PCT: 11 % (ref 3–12)
MPV: 9.6 fL (ref 8.6–12.4)
Neutro Abs: 4.4 10*3/uL (ref 1.7–7.7)
Neutrophils Relative %: 58 % (ref 43–77)
Platelets: 199 10*3/uL (ref 150–400)
RBC: 4.56 MIL/uL (ref 3.87–5.11)
RDW: 17.9 % — ABNORMAL HIGH (ref 11.5–15.5)
WBC: 7.6 10*3/uL (ref 4.0–10.5)

## 2014-10-29 LAB — GC/CHLAMYDIA PROBE AMP, URINE
CHLAMYDIA, SWAB/URINE, PCR: NEGATIVE
GC PROBE AMP, URINE: NEGATIVE

## 2014-10-31 ENCOUNTER — Encounter (HOSPITAL_COMMUNITY): Payer: Self-pay | Admitting: *Deleted

## 2014-10-31 ENCOUNTER — Inpatient Hospital Stay (HOSPITAL_COMMUNITY)
Admission: AD | Admit: 2014-10-31 | Discharge: 2014-10-31 | Disposition: A | Discharge status: Home / Self Care | Payer: Medicaid Other | Source: Ambulatory Visit | Attending: Family Medicine | Admitting: Family Medicine

## 2014-10-31 DIAGNOSIS — Z3A36 36 weeks gestation of pregnancy: Secondary | ICD-10-CM | POA: Insufficient documentation

## 2014-10-31 DIAGNOSIS — Z3493 Encounter for supervision of normal pregnancy, unspecified, third trimester: Secondary | ICD-10-CM | POA: Insufficient documentation

## 2014-10-31 LAB — URINALYSIS, ROUTINE W REFLEX MICROSCOPIC
Bilirubin Urine: NEGATIVE
GLUCOSE, UA: NEGATIVE mg/dL
Hgb urine dipstick: NEGATIVE
KETONES UR: NEGATIVE mg/dL
Nitrite: NEGATIVE
PH: 5.5 (ref 5.0–8.0)
PROTEIN: NEGATIVE mg/dL
Specific Gravity, Urine: 1.015 (ref 1.005–1.030)
Urobilinogen, UA: 0.2 mg/dL (ref 0.0–1.0)

## 2014-10-31 LAB — URINE MICROSCOPIC-ADD ON

## 2014-10-31 NOTE — MAU Note (Signed)
Contractions all day. Having a lot of lower back pain and pelvic pain. Denies LOF or bleeding

## 2014-10-31 NOTE — Discharge Instructions (Signed)
Fetal Movement Counts °Patient Name: __________________________________________________ Patient Due Date: ____________________ °Performing a fetal movement count is highly recommended in high-risk pregnancies, but it is good for every pregnant woman to do. Your health care provider may ask you to start counting fetal movements at 28 weeks of the pregnancy. Fetal movements often increase: °· After eating a full meal. °· After physical activity. °· After eating or drinking something sweet or cold. °· At rest. °Pay attention to when you feel the baby is most active. This will help you notice a pattern of your baby's sleep and wake cycles and what factors contribute to an increase in fetal movement. It is important to perform a fetal movement count at the same time each day when your baby is normally most active.  °HOW TO COUNT FETAL MOVEMENTS °1. Find a quiet and comfortable area to sit or lie down on your left side. Lying on your left side provides the best blood and oxygen circulation to your baby. °2. Write down the day and time on a sheet of paper or in a journal. °3. Start counting kicks, flutters, swishes, rolls, or jabs in a 2-hour period. You should feel at least 10 movements within 2 hours. °4. If you do not feel 10 movements in 2 hours, wait 2-3 hours and count again. Look for a change in the pattern or not enough counts in 2 hours. °SEEK MEDICAL CARE IF: °· You feel less than 10 counts in 2 hours, tried twice. °· There is no movement in over an hour. °· The pattern is changing or taking longer each day to reach 10 counts in 2 hours. °· You feel the baby is not moving as he or she usually does. °Date: ____________ Movements: ____________ Start time: ____________ Finish time: ____________  °Date: ____________ Movements: ____________ Start time: ____________ Finish time: ____________ °Date: ____________ Movements: ____________ Start time: ____________ Finish time: ____________ °Date: ____________ Movements:  ____________ Start time: ____________ Finish time: ____________ °Date: ____________ Movements: ____________ Start time: ____________ Finish time: ____________ °Date: ____________ Movements: ____________ Start time: ____________ Finish time: ____________ °Date: ____________ Movements: ____________ Start time: ____________ Finish time: ____________ °Date: ____________ Movements: ____________ Start time: ____________ Finish time: ____________  °Date: ____________ Movements: ____________ Start time: ____________ Finish time: ____________ °Date: ____________ Movements: ____________ Start time: ____________ Finish time: ____________ °Date: ____________ Movements: ____________ Start time: ____________ Finish time: ____________ °Date: ____________ Movements: ____________ Start time: ____________ Finish time: ____________ °Date: ____________ Movements: ____________ Start time: ____________ Finish time: ____________ °Date: ____________ Movements: ____________ Start time: ____________ Finish time: ____________ °Date: ____________ Movements: ____________ Start time: ____________ Finish time: ____________  °Date: ____________ Movements: ____________ Start time: ____________ Finish time: ____________ °Date: ____________ Movements: ____________ Start time: ____________ Finish time: ____________ °Date: ____________ Movements: ____________ Start time: ____________ Finish time: ____________ °Date: ____________ Movements: ____________ Start time: ____________ Finish time: ____________ °Date: ____________ Movements: ____________ Start time: ____________ Finish time: ____________ °Date: ____________ Movements: ____________ Start time: ____________ Finish time: ____________ °Date: ____________ Movements: ____________ Start time: ____________ Finish time: ____________  °Date: ____________ Movements: ____________ Start time: ____________ Finish time: ____________ °Date: ____________ Movements: ____________ Start time: ____________ Finish  time: ____________ °Date: ____________ Movements: ____________ Start time: ____________ Finish time: ____________ °Date: ____________ Movements: ____________ Start time: ____________ Finish time: ____________ °Date: ____________ Movements: ____________ Start time: ____________ Finish time: ____________ °Date: ____________ Movements: ____________ Start time: ____________ Finish time: ____________ °Date: ____________ Movements: ____________ Start time: ____________ Finish time: ____________  °Date: ____________ Movements: ____________ Start time: ____________ Finish   time: ____________ °Date: ____________ Movements: ____________ Start time: ____________ Finish time: ____________ °Date: ____________ Movements: ____________ Start time: ____________ Finish time: ____________ °Date: ____________ Movements: ____________ Start time: ____________ Finish time: ____________ °Date: ____________ Movements: ____________ Start time: ____________ Finish time: ____________ °Date: ____________ Movements: ____________ Start time: ____________ Finish time: ____________ °Date: ____________ Movements: ____________ Start time: ____________ Finish time: ____________  °Date: ____________ Movements: ____________ Start time: ____________ Finish time: ____________ °Date: ____________ Movements: ____________ Start time: ____________ Finish time: ____________ °Date: ____________ Movements: ____________ Start time: ____________ Finish time: ____________ °Date: ____________ Movements: ____________ Start time: ____________ Finish time: ____________ °Date: ____________ Movements: ____________ Start time: ____________ Finish time: ____________ °Date: ____________ Movements: ____________ Start time: ____________ Finish time: ____________ °Date: ____________ Movements: ____________ Start time: ____________ Finish time: ____________  °Date: ____________ Movements: ____________ Start time: ____________ Finish time: ____________ °Date: ____________  Movements: ____________ Start time: ____________ Finish time: ____________ °Date: ____________ Movements: ____________ Start time: ____________ Finish time: ____________ °Date: ____________ Movements: ____________ Start time: ____________ Finish time: ____________ °Date: ____________ Movements: ____________ Start time: ____________ Finish time: ____________ °Date: ____________ Movements: ____________ Start time: ____________ Finish time: ____________ °Date: ____________ Movements: ____________ Start time: ____________ Finish time: ____________  °Date: ____________ Movements: ____________ Start time: ____________ Finish time: ____________ °Date: ____________ Movements: ____________ Start time: ____________ Finish time: ____________ °Date: ____________ Movements: ____________ Start time: ____________ Finish time: ____________ °Date: ____________ Movements: ____________ Start time: ____________ Finish time: ____________ °Date: ____________ Movements: ____________ Start time: ____________ Finish time: ____________ °Date: ____________ Movements: ____________ Start time: ____________ Finish time: ____________ °Document Released: 05/17/2006 Document Revised: 09/01/2013 Document Reviewed: 02/12/2012 °[employer]® Patient Information ©2015 [employer]. This information is not intended to replace advice given to you by your health care provider. Make sure you discuss any questions you have with your health care provider. °Braxton Hicks Contractions °Contractions of the uterus can occur throughout pregnancy. Contractions are not always a sign that you are in labor.  °WHAT ARE BRAXTON HICKS CONTRACTIONS?  °Contractions that occur before labor are called Braxton Hicks contractions, or false labor. Toward the end of pregnancy (32-34 weeks), these contractions can develop more often and may become more forceful. This is not true labor because these contractions do not result in opening (dilatation) and thinning of the cervix. They  are sometimes difficult to tell apart from true labor because these contractions can be forceful and people have different pain tolerances. You should not feel embarrassed if you go to the hospital with false labor. Sometimes, the only way to tell if you are in true labor is for your health care provider to look for changes in the cervix. °If there are no prenatal problems or other health problems associated with the pregnancy, it is completely safe to be sent home with false labor and await the onset of true labor. °HOW CAN YOU TELL THE DIFFERENCE BETWEEN TRUE AND FALSE LABOR? °False Labor °· The contractions of false labor are usually shorter and not as hard as those of true labor.   °· The contractions are usually irregular.   °· The contractions are often felt in the front of the lower abdomen and in the groin.   °· The contractions may go away when you walk around or change positions while lying down.   °· The contractions get weaker and are shorter lasting as time goes on.   °· The contractions do not usually become progressively stronger, regular, and closer together as with true labor.   °True Labor °5. Contractions in true labor last 30-70 seconds, become   very regular, usually become more intense, and increase in frequency.   °6. The contractions do not go away with walking.   °7. The discomfort is usually felt in the top of the uterus and spreads to the lower abdomen and low back.   °8. True labor can be determined by your health care provider with an exam. This will show that the cervix is dilating and getting thinner.   °WHAT TO REMEMBER °· Keep up with your usual exercises and follow other instructions given by your health care provider.   °· Take medicines as directed by your health care provider.   °· Keep your regular prenatal appointments.   °· Eat and drink lightly if you think you are going into labor.   °· If Braxton Hicks contractions are making you uncomfortable:   °· Change your position from  lying down or resting to walking, or from walking to resting.   °· Sit and rest in a tub of warm water.   °· Drink 2-3 glasses of water. Dehydration may cause these contractions.   °· Do slow and deep breathing several times an hour.   °WHEN SHOULD I SEEK IMMEDIATE MEDICAL CARE? °Seek immediate medical care if: °· Your contractions become stronger, more regular, and closer together.   °· You have fluid leaking or gushing from your vagina.   °· You have a fever.   °· You pass blood-tinged mucus.   °· You have vaginal bleeding.   °· You have continuous abdominal pain.   °· You have low back pain that you never had before.   °· You feel your baby's head pushing down and causing pelvic pressure.   °· Your baby is not moving as much as it used to.   °Document Released: 04/17/2005 Document Revised: 04/22/2013 Document Reviewed: 01/27/2013 °ExitCare® Patient Information ©2015 [employer]. This information is not intended to replace advice given to you by your health care provider. Make sure you discuss any questions you have with your health care provider. ° °

## 2014-11-01 LAB — CULTURE, BETA STREP (GROUP B ONLY)

## 2014-11-03 ENCOUNTER — Telehealth: Payer: Self-pay

## 2014-11-03 NOTE — Telephone Encounter (Signed)
Patient called back and made aware that her hgb was 10 and Dr. Penne LashLeggett would like for her to continue her iron 2-3 times a day until delivery. Amber StammerJennifer Lennie Vasco RN BSN

## 2014-11-03 NOTE — Telephone Encounter (Signed)
-----   Message from Lesly DukesKelly H Leggett, MD sent at 10/31/2014 10:56 AM EDT ----- hgb = 10.  Continue iron 2-3 times a day.

## 2014-11-03 NOTE — Telephone Encounter (Signed)
Left message for patient to return call to office. Amber Newton 

## 2014-11-04 ENCOUNTER — Ambulatory Visit (INDEPENDENT_AMBULATORY_CARE_PROVIDER_SITE_OTHER): Payer: Medicaid Other | Admitting: Obstetrics & Gynecology

## 2014-11-04 VITALS — BP 124/73 | HR 94 | Wt 215.0 lb

## 2014-11-04 DIAGNOSIS — Z3483 Encounter for supervision of other normal pregnancy, third trimester: Secondary | ICD-10-CM

## 2014-11-04 DIAGNOSIS — O09892 Supervision of other high risk pregnancies, second trimester: Secondary | ICD-10-CM

## 2014-11-04 NOTE — Progress Notes (Signed)
Subjective:  Amber Newton is a 29 y.o. [phone] at 7184w0d being seen today for ongoing prenatal care.  Patient reports no complaints.  Contractions: Irregular.  Vag. Bleeding: None. Movement: Present. Denies leaking of fluid.   The following portions of the patient's history were reviewed and updated as appropriate: allergies, current medications, past family history, past medical history, past social history, past surgical history and problem list.   Objective:   Filed Vitals:   11/04/14 0925  BP: 124/73  Pulse: 94  Weight: 215 lb (97.523 kg)    Fetal Status: Fetal Heart Rate (bpm): 147 lb Fundal Height: 38 cm Movement: Present  Presentation: Vertex  General:  Alert, oriented and cooperative. Patient is in no acute distress.  Skin: Skin is warm and dry. No rash noted.   Cardiovascular: Normal heart rate noted  Respiratory: Normal respiratory effort, no problems with respiration noted  Abdomen: Soft, gravid, appropriate for gestational age. Pain/Pressure: Present     Vaginal: Vag. Bleeding: None.    Vag D/C Character: Thin  Cervix: Exam revealed Dilation: Fingertip Effacement (%): Thick Station: -3  Extremities: Normal range of motion.  Edema: Trace  Mental Status: Normal mood and affect. Normal behavior. Normal judgment and thought content.   Urinalysis: Urine Protein: Trace Urine Glucose: Negative  Assessment and Plan:  Pregnancy: [id number] at 3484w0d  1. Short interval between pregnancies affecting pregnancy in second trimester, antepartum Term labor symptoms and general obstetric precautions including but not limited to vaginal bleeding, contractions, leaking of fluid and fetal movement were reviewed in detail with the patient.  Please refer to After Visit Summary for other counseling recommendations.   Return in about 1 week (around 11/11/2014) for OB visit.   Tereso NewcomerUgonna A Jacqueli Pangallo, MD

## 2014-11-04 NOTE — Patient Instructions (Signed)
Return to clinic for any obstetric concerns or go to [facility] for evaluation  

## 2014-11-11 ENCOUNTER — Ambulatory Visit (INDEPENDENT_AMBULATORY_CARE_PROVIDER_SITE_OTHER): Payer: Medicaid Other | Admitting: Obstetrics & Gynecology

## 2014-11-11 VITALS — BP 123/73 | HR 89 | Wt 218.0 lb

## 2014-11-11 DIAGNOSIS — O09892 Supervision of other high risk pregnancies, second trimester: Secondary | ICD-10-CM

## 2014-11-11 NOTE — Patient Instructions (Signed)
Return to clinic for any obstetric concerns or go to [facility] for evaluation  

## 2014-11-11 NOTE — Progress Notes (Signed)
Subjective:  Amber Newton is a 29 y.o. [phone] at 272w0d being seen today for ongoing prenatal care.  Patient reports occasional contractions.  Contractions: Irregular.  Vag. Bleeding: None. Movement: Present. Denies leaking of fluid.   The following portions of the patient's history were reviewed and updated as appropriate: allergies, current medications, past family history, past medical history, past social history, past surgical history and problem list.   Objective:   Filed Vitals:   11/11/14 0926  BP: 123/73  Pulse: 89  Weight: 218 lb (98.884 kg)    Fetal Status: Fetal Heart Rate (bpm): 134 Fundal Height: 40 cm Movement: Present  Presentation: Vertex  General:  Alert, oriented and cooperative. Patient is in no acute distress.  Skin: Skin is warm and dry. No rash noted.   Cardiovascular: Normal heart rate noted  Respiratory: Normal respiratory effort, no problems with respiration noted  Abdomen: Soft, gravid, appropriate for gestational age. Pain/Pressure: Present     Vaginal: Vag. Bleeding: None.    Vag D/C Character: Thin  Cervix: Exam revealed Dilation: Fingertip Effacement (%): Thick Station: -3  Extremities: Normal range of motion.  Edema: Trace  Mental Status: Normal mood and affect. Normal behavior. Normal judgment and thought content.   Urinalysis: Urine Protein: Trace Urine Glucose: Negative  Assessment and Plan:  Pregnancy: [name] at 672w0d  1. Short interval between pregnancies affecting pregnancy in second trimester, antepartum No cervical change.  Term labor symptoms and general obstetric precautions including but not limited to vaginal bleeding, contractions, leaking of fluid and fetal movement were reviewed in detail with the patient. Please refer to After Visit Summary for other counseling recommendations.  Return in about 1 week (around 11/18/2014) for OB visit.   Tereso NewcomerUgonna A Marlenne Ridge, MD

## 2014-11-18 ENCOUNTER — Inpatient Hospital Stay (HOSPITAL_COMMUNITY): Payer: Medicaid Other | Admitting: Anesthesiology

## 2014-11-18 ENCOUNTER — Inpatient Hospital Stay (HOSPITAL_COMMUNITY)
Admission: AD | Admit: 2014-11-18 | Discharge: 2014-11-20 | DRG: 775 | Disposition: A | Discharge status: Home / Self Care | Payer: Medicaid Other | Source: Ambulatory Visit | Attending: Obstetrics & Gynecology | Admitting: Obstetrics & Gynecology

## 2014-11-18 ENCOUNTER — Encounter (HOSPITAL_COMMUNITY): Payer: Self-pay | Admitting: *Deleted

## 2014-11-18 ENCOUNTER — Ambulatory Visit (INDEPENDENT_AMBULATORY_CARE_PROVIDER_SITE_OTHER): Payer: Medicaid Other | Admitting: Obstetrics & Gynecology

## 2014-11-18 ENCOUNTER — Encounter: Payer: Self-pay | Admitting: Obstetrics & Gynecology

## 2014-11-18 VITALS — BP 133/72 | HR 98 | Wt 219.0 lb

## 2014-11-18 DIAGNOSIS — O99824 Streptococcus B carrier state complicating childbirth: Secondary | ICD-10-CM | POA: Diagnosis present

## 2014-11-18 DIAGNOSIS — Z3A39 39 weeks gestation of pregnancy: Secondary | ICD-10-CM | POA: Diagnosis present

## 2014-11-18 DIAGNOSIS — Z8249 Family history of ischemic heart disease and other diseases of the circulatory system: Secondary | ICD-10-CM | POA: Diagnosis not present

## 2014-11-18 DIAGNOSIS — Z833 Family history of diabetes mellitus: Secondary | ICD-10-CM | POA: Diagnosis not present

## 2014-11-18 DIAGNOSIS — O09892 Supervision of other high risk pregnancies, second trimester: Secondary | ICD-10-CM

## 2014-11-18 LAB — CBC
HEMATOCRIT: 34.1 % — AB (ref 36.0–46.0)
HEMOGLOBIN: 10.5 g/dL — AB (ref 12.0–15.0)
MCH: 22.1 pg — ABNORMAL LOW (ref 26.0–34.0)
MCHC: 30.8 g/dL (ref 30.0–36.0)
MCV: 71.6 fL — AB (ref 78.0–100.0)
Platelets: 229 10*3/uL (ref 150–400)
RBC: 4.76 MIL/uL (ref 3.87–5.11)
RDW: 18.8 % — AB (ref 11.5–15.5)
WBC: 9 10*3/uL (ref 4.0–10.5)

## 2014-11-18 LAB — TYPE AND SCREEN
ABO/RH(D): A POS
Antibody Screen: NEGATIVE

## 2014-11-18 MED ORDER — SODIUM CHLORIDE 0.9 % IV SOLN
2.0000 g | Freq: Once | INTRAVENOUS | Status: AC
  Administered 2014-11-18: 2 g via INTRAVENOUS
  Filled 2014-11-18: qty 2000

## 2014-11-18 MED ORDER — LACTATED RINGERS IV SOLN
500.0000 mL | INTRAVENOUS | Status: DC | PRN
  Administered 2014-11-18: 500 mL via INTRAVENOUS

## 2014-11-18 MED ORDER — OXYTOCIN 40 UNITS IN LACTATED RINGERS INFUSION - SIMPLE MED
62.5000 mL/h | INTRAVENOUS | Status: DC
  Filled 2014-11-18: qty 1000

## 2014-11-18 MED ORDER — OXYTOCIN BOLUS FROM INFUSION
500.0000 mL | INTRAVENOUS | Status: DC
  Administered 2014-11-19: 500 mL via INTRAVENOUS

## 2014-11-18 MED ORDER — EPHEDRINE 5 MG/ML INJ
10.0000 mg | INTRAVENOUS | Status: DC | PRN
  Filled 2014-11-18: qty 2

## 2014-11-18 MED ORDER — DIPHENHYDRAMINE HCL 50 MG/ML IJ SOLN: 12.5000 mg | INTRAMUSCULAR | Status: DC | PRN

## 2014-11-18 MED ORDER — LIDOCAINE HCL (PF) 1 % IJ SOLN
INTRAMUSCULAR | Status: DC | PRN
  Administered 2014-11-18: 6 mL
  Administered 2014-11-18: 4 mL

## 2014-11-18 MED ORDER — FLEET ENEMA 7-19 GM/118ML RE ENEM: 1.0000 | ENEMA | RECTAL | Status: DC | PRN

## 2014-11-18 MED ORDER — FENTANYL 2.5 MCG/ML BUPIVACAINE 1/10 % EPIDURAL INFUSION (WH - ANES)
14.0000 mL/h | INTRAMUSCULAR | Status: DC | PRN
  Administered 2014-11-18 (×2): 14 mL/h via EPIDURAL
  Filled 2014-11-18 (×2): qty 125

## 2014-11-18 MED ORDER — LIDOCAINE HCL (PF) 1 % IJ SOLN
30.0000 mL | INTRAMUSCULAR | Status: DC | PRN
  Filled 2014-11-18: qty 30

## 2014-11-18 MED ORDER — OXYCODONE-ACETAMINOPHEN 5-325 MG PO TABS: 1.0000 | ORAL_TABLET | ORAL | Status: DC | PRN

## 2014-11-18 MED ORDER — ACETAMINOPHEN 325 MG PO TABS: 650.0000 mg | ORAL_TABLET | ORAL | Status: DC | PRN

## 2014-11-18 MED ORDER — FENTANYL 2.5 MCG/ML BUPIVACAINE 1/10 % EPIDURAL INFUSION (WH - ANES): 14.0000 mL/h | INTRAMUSCULAR | Status: DC | PRN

## 2014-11-18 MED ORDER — LACTATED RINGERS IV SOLN
INTRAVENOUS | Status: DC
  Administered 2014-11-18 (×2): via INTRAVENOUS

## 2014-11-18 MED ORDER — PHENYLEPHRINE 40 MCG/ML (10ML) SYRINGE FOR IV PUSH (FOR BLOOD PRESSURE SUPPORT)
80.0000 ug | PREFILLED_SYRINGE | INTRAVENOUS | Status: DC | PRN
  Filled 2014-11-18: qty 20
  Filled 2014-11-18: qty 2

## 2014-11-18 MED ORDER — CITRIC ACID-SODIUM CITRATE 334-500 MG/5ML PO SOLN: 30.0000 mL | ORAL | Status: DC | PRN

## 2014-11-18 MED ORDER — OXYCODONE-ACETAMINOPHEN 5-325 MG PO TABS: 2.0000 | ORAL_TABLET | ORAL | Status: DC | PRN

## 2014-11-18 MED ORDER — ONDANSETRON HCL 4 MG/2ML IJ SOLN
4.0000 mg | Freq: Four times a day (QID) | INTRAMUSCULAR | Status: DC | PRN
  Administered 2014-11-18: 4 mg via INTRAVENOUS
  Filled 2014-11-18: qty 2

## 2014-11-18 NOTE — MAU Note (Signed)
Patient was seen at [place] office in [place] this AM and had her membranes stripped.  She states she started having intense contractions around 1430.  +Fetal movement.  Denies LOF or vaginal bleeding.  Was 3cm in office.

## 2014-11-18 NOTE — MAU Note (Signed)
Patient to room 63 for admission

## 2014-11-18 NOTE — Progress Notes (Signed)
Labor Progress Note  S: Pt seen w/ RN at bedside. Currently w/o complaints. States that epidural has helped sig.  O:  BP 126/76 mmHg  Pulse 95  Temp(Src) 98.5 F (36.9 C) (Axillary)  Resp 18  Ht 5\' 8"  (1.727 m)  Wt 99.338 kg (219 lb)  BMI 33.31 kg/m2  SpO2 100%  LMP 02/18/2014 Cat 1 Cervical Exam: 6.5/100/-1  A&P: 89 y.o. [id number] 2262w0d spontaneous labor progressing well. Now with epidural in place. Amp dose completed. #Given pt exam, will AROM at this time #Expect normal progression to delivery.  [name], MD 7:15 PM  I have participated in the care of this patient and I agree with the above. Cam HaiSHAW, KIMBERLY CNM 8:47 PM 11/18/2014

## 2014-11-18 NOTE — Anesthesia Procedure Notes (Signed)

## 2014-11-18 NOTE — H&P (Signed)
Amber Newton is a 29 y.o. female presenting for frequent recurrent contractions of moderate intensity. Maternal Medical History:  Reason for admission: Contractions.  Nausea.  Contractions: Onset was 3-5 hours ago.   Frequency: regular.   Duration is approximately 1 minute.   Perceived severity is moderate.    Fetal activity: Perceived fetal activity is normal.    Prenatal complications: No bleeding, cholelithiasis, PIH or preterm labor.     OB History    Gravida Para Term Preterm AB TAB SAB Ectopic Multiple Living   0 1 0 1 0 0 2     Past Medical History  Diagnosis Date  . Abnormal Pap smear     HPV, colpo  . Infection     UTI  . Headache(784.0)   . Vaginal Pap smear, abnormal   . Anemia    Past Surgical History  Procedure Laterality Date  . No past surgeries    . Wisdom tooth extraction     Family History: family history includes Cancer in her father, maternal aunt, and paternal grandfather; Diabetes in her maternal grandmother and paternal grandmother; Hypertension in her maternal grandmother and mother. Social History:  reports that she has never smoked. She has never used smokeless tobacco. She reports that she does not drink alcohol or use illicit drugs.   Prenatal Transfer Tool  Maternal Diabetes: No Genetic Screening: Normal Maternal Ultrasounds/Referrals: Normal Fetal Ultrasounds or other Referrals:  None Maternal Substance Abuse:  No Significant Maternal Medications:  None Significant Maternal Lab Results:  Lab values include: Group B Strep positive Other Comments:  None  Review of Systems  Respiratory: Negative for shortness of breath.   Cardiovascular: Negative for chest pain and leg swelling.  Gastrointestinal: Negative for nausea, vomiting, diarrhea and constipation.  Neurological: Negative for headaches.  All other systems reviewed and are negative.   Dilation: 6 Effacement (%): 70 Station: -3 Exam by:: K. Koontz, RNC Blood pressure  129/81, pulse 90, temperature 97.9 F (36.6 C), temperature source Oral, resp. rate 18, last menstrual period 02/18/2014, unknown if currently breastfeeding. Maternal Exam:  Uterine Assessment: Contraction strength is moderate.  Contraction duration is 1 minute. Contraction frequency is regular.   Abdomen: Patient reports no abdominal tenderness. Fetal presentation: vertex  Introitus: Normal vulva. Normal vagina.  Pelvis: adequate for delivery.   Cervix: Cervix evaluated by digital exam.     Physical Exam  Constitutional: She is oriented to person, place, and time. She appears well-developed and well-nourished. No distress.  HENT:  Head: Normocephalic and atraumatic.  Eyes: Conjunctivae and EOM are normal.  Neck: Normal range of motion.  Cardiovascular: Normal rate, regular rhythm and normal heart sounds.   Respiratory: Effort normal and breath sounds normal. No respiratory distress.  GI: Soft. She exhibits no distension. There is no tenderness. There is no rebound and no guarding.  Genitourinary: Vagina normal and uterus normal.  Musculoskeletal: Normal range of motion.  Neurological: She is alert and oriented to person, place, and time.  Skin: Skin is warm. She is not diaphoretic.  Psychiatric: She has a normal mood and affect. Her behavior is normal. Judgment and thought content normal.    Prenatal labs: ABO, Rh: A/POS/-- (02/08 1105) Antibody: NEG (02/08 1105) Rubella: 2.24 (02/08 1105) RPR: NON REAC (05/06 0950)  HBsAg: NEGATIVE (02/08 1105)  HIV: NONREACTIVE (05/06 0950)  GBS:     Assessment/Plan: Pt is a 29 yo [id number] who presented to MAU w/ recurrent contractions of moderate strength. She has noted  no loss of fluid or vaginal bleeding. She is GBS pos, but has had no other complications during the pregnancy. Membranes were stripped this morning at her regular weekly OB appt.  #Normal admission orders placed #amp ordered for GBS ppx #epidural ordered per pt #expect  normal SVD w/o complications.  Amber Newton 11/18/2014, 5:50 PM    CNM attestation:  I have seen and examined this patient; I agree with above documentation in the resident's note.   Amber Newton is a 29 y.o. [phone] [id number] here for SOL  PE: BP 131/72 mmHg  Pulse 86  Temp(Src) 97.8 F (36.6 C) (Oral)  Resp 18  Ht 5\' 8"  (1.727 m)  Wt 99.338 kg (219 lb)  BMI 33.31 kg/m2  SpO2 100%  LMP 02/18/2014  Resp: normal effort, no distress Abd: gravid  ROS, labs, PMH reviewed  [facility]: [facility] Amp for GBS prophylaxis Anticipate SVD  Amber Newton CNM 11/18/2014, 8:45 PM

## 2014-11-18 NOTE — Anesthesia Preprocedure Evaluation (Signed)

## 2014-11-18 NOTE — Progress Notes (Signed)
Labor Progress Note  S: Much more comfortable now that she has epidural   O:  BP 127/76 mmHg  Pulse 102  Temp(Src) 97.8 F (36.6 C) (Oral)  Resp 18  Ht 5\' 8"  (1.727 m)  Wt 219 lb (99.338 kg)  BMI 33.31 kg/m2  SpO2 100%  LMP 02/18/2014  NST reactive. Some variable vs. Late decels seen on strip that resolved with positional changes and fluid bolus. Ctx every 2-4 minutes.  CVE: Dilation: 7.5 Effacement (%): 80 Cervical Position: Posterior Station: -1 Presentation: Vertex Exam by:: Susy ManorErica Johnson, RN     A&P: 29 y.o. [id number] 1374w0d SOL  #labor: progressing very well, will continue to monitor FHT closely  #Expect normal progression to delivery   De Hollingsheadatherine L Retia Cordle, DO 10:03 PM

## 2014-11-18 NOTE — Progress Notes (Signed)
Contraction continue to be irregular but getting stronger per pt.

## 2014-11-18 NOTE — Progress Notes (Signed)
Subjective:  Amber Newton is a 29 y.o. [phone] at 7054w0d being seen today for ongoing prenatal care.  Patient reports contractions since a week ago.  Contractions: Irregular.  Vag. Bleeding: None. Movement: Present. Denies leaking of fluid.   The following portions of the patient's history were reviewed and updated as appropriate: allergies, current medications, past family history, past medical history, past social history, past surgical history and problem list.   Objective:   Filed Vitals:   11/18/14 0922  BP: 133/72  Pulse: 98  Weight: 219 lb (99.338 kg)    Fetal Status: Fetal Heart Rate (bpm): 157 Fundal Height: 40 cm Movement: Present     General:  Alert, oriented and cooperative. Patient is in no acute distress.  Skin: Skin is warm and dry. No rash noted.   Cardiovascular: Normal heart rate noted  Respiratory: Normal respiratory effort, no problems with respiration noted  Abdomen: Soft, gravid, appropriate for gestational age. Pain/Pressure: Present     Vaginal: Vag. Bleeding: None.    Vag D/C Character: Mucous  Cervix: Exam revealed       2/50/-2. Membranes stripped  Extremities: Normal range of motion.  Edema: Trace  Mental Status: Normal mood and affect. Normal behavior. Normal judgment and thought content.   Urinalysis: Urine Protein: 1+ Urine Glucose: Negative  Assessment and Plan:  Pregnancy: [id number] at 6154w0d  1. Short interval between pregnancies affecting pregnancy in second trimester, antepartum  Term labor symptoms and general obstetric precautions including but not limited to vaginal bleeding, contractions, leaking of fluid and fetal movement were reviewed in detail with the patient. Please refer to After Visit Summary for other counseling recommendations.  No Follow-up on file.   Allie BossierMyra C Misaki Sozio, MD

## 2014-11-19 ENCOUNTER — Encounter (HOSPITAL_COMMUNITY): Payer: Self-pay

## 2014-11-19 DIAGNOSIS — Z3A39 39 weeks gestation of pregnancy: Secondary | ICD-10-CM

## 2014-11-19 LAB — RPR: RPR Ser Ql: NONREACTIVE

## 2014-11-19 MED ORDER — WITCH HAZEL-GLYCERIN EX PADS
1.0000 | MEDICATED_PAD | CUTANEOUS | Status: DC | PRN
Start: 2014-11-19 — End: 2014-11-20

## 2014-11-19 MED ORDER — SIMETHICONE 80 MG PO CHEW: 80.0000 mg | CHEWABLE_TABLET | ORAL | Status: DC | PRN

## 2014-11-19 MED ORDER — TETANUS-DIPHTH-ACELL PERTUSSIS 5-2.5-18.5 LF-MCG/0.5 IM SUSP: 0.5000 mL | Freq: Once | INTRAMUSCULAR | Status: DC

## 2014-11-19 MED ORDER — DIBUCAINE 1 % RE OINT: 1.0000 "application " | TOPICAL_OINTMENT | RECTAL | Status: DC | PRN

## 2014-11-19 MED ORDER — DIPHENHYDRAMINE HCL 25 MG PO CAPS: 25.0000 mg | ORAL_CAPSULE | Freq: Four times a day (QID) | ORAL | Status: DC | PRN

## 2014-11-19 MED ORDER — IBUPROFEN 600 MG PO TABS
600.0000 mg | ORAL_TABLET | Freq: Four times a day (QID) | ORAL | Status: DC
  Administered 2014-11-19 – 2014-11-20 (×6): 600 mg via ORAL
  Filled 2014-11-19 (×6): qty 1

## 2014-11-19 MED ORDER — BENZOCAINE-MENTHOL 20-0.5 % EX AERO
1.0000 "application " | INHALATION_SPRAY | CUTANEOUS | Status: DC | PRN
  Administered 2014-11-19: 1 via TOPICAL
  Filled 2014-11-19: qty 56

## 2014-11-19 MED ORDER — DOCUSATE SODIUM 100 MG PO CAPS: 100.0000 mg | ORAL_CAPSULE | Freq: Two times a day (BID) | ORAL | Status: DC | PRN

## 2014-11-19 MED ORDER — ONDANSETRON HCL 4 MG PO TABS: 4.0000 mg | ORAL_TABLET | ORAL | Status: DC | PRN

## 2014-11-19 MED ORDER — PRENATAL MULTIVITAMIN CH
1.0000 | ORAL_TABLET | Freq: Every day | ORAL | Status: DC
  Administered 2014-11-19 – 2014-11-20 (×2): 1 via ORAL
  Filled 2014-11-19 (×2): qty 1

## 2014-11-19 MED ORDER — LANOLIN HYDROUS EX OINT: TOPICAL_OINTMENT | CUTANEOUS | Status: DC | PRN

## 2014-11-19 MED ORDER — SENNOSIDES-DOCUSATE SODIUM 8.6-50 MG PO TABS
2.0000 | ORAL_TABLET | ORAL | Status: DC
  Administered 2014-11-20: 2 via ORAL
  Filled 2014-11-19: qty 2

## 2014-11-19 MED ORDER — ZOLPIDEM TARTRATE 5 MG PO TABS: 5.0000 mg | ORAL_TABLET | Freq: Every evening | ORAL | Status: DC | PRN

## 2014-11-19 MED ORDER — ONDANSETRON HCL 4 MG/2ML IJ SOLN: 4.0000 mg | INTRAMUSCULAR | Status: DC | PRN

## 2014-11-19 MED ORDER — ACETAMINOPHEN 325 MG PO TABS
650.0000 mg | ORAL_TABLET | ORAL | Status: DC | PRN
  Filled 2014-11-19: qty 2

## 2014-11-19 MED ORDER — FERROUS SULFATE 325 (65 FE) MG PO TABS
325.0000 mg | ORAL_TABLET | Freq: Every day | ORAL | Status: DC
  Administered 2014-11-19 – 2014-11-20 (×2): 325 mg via ORAL
  Filled 2014-11-19 (×2): qty 1

## 2014-11-19 MED ORDER — OXYCODONE-ACETAMINOPHEN 5-325 MG PO TABS
1.0000 | ORAL_TABLET | ORAL | Status: DC | PRN
  Administered 2014-11-19 – 2014-11-20 (×3): 1 via ORAL
  Filled 2014-11-19 (×3): qty 1

## 2014-11-19 NOTE — Anesthesia Postprocedure Evaluation (Signed)
  Anesthesia Post-op Note  Patient: Amber Newton  Procedure(s) Performed: * No procedures listed *  Patient Location: Mother/Baby  Anesthesia Type:Epidural  Level of Consciousness: awake, alert , oriented and patient cooperative  Airway and Oxygen Therapy: Patient Spontanous Breathing  Post-op Pain: none  Post-op Assessment: Post-op Vital signs reviewed, Patient's Cardiovascular Status Stable, Respiratory Function Stable, Patent Airway, No headache, No backache and Patient able to bend at knees              Post-op Vital Signs: Reviewed and stable  Last Vitals:  Filed Vitals:   11/19/14 0445  BP: 118/46  Pulse: 83  Temp: 36.9 C  Resp: 20    Complications: No apparent anesthesia complications

## 2014-11-20 MED ORDER — OXYCODONE-ACETAMINOPHEN 5-325 MG PO TABS: 1.0000 | ORAL_TABLET | ORAL | Status: DC | PRN

## 2014-11-20 NOTE — Progress Notes (Cosign Needed)
Post Partum Day 1 Subjective:  Amber Newton is a 29 y.o. female [id number] PPD#1 SVD at 0131 at [redacted]w[redacted]d gestation. up ad lib, voiding, tolerating PO and + flatus. No BM yet. Bleeding is improving, no N/V, no dizziness. Having occasional 10/10 crampy lower abdominal pain, worse when breastfeeding. Percocet will lower the pain to 6/10 pain. No acute problems overnight except for crampy pain. Breastfeeding well except for some tenderness around nipples. Doing combination breast/bottle feeding as milk is coming in, but planning on exclusively breastfeeding. Having some lightheadedness that started this morning that she thinks may be from being tired.  Boy/br/LARC?Marland Kitchen Doesn't want permanent contraception (BTL) at this time.  GBS+ and prophylaxed with ampicillin  Objective: Blood pressure 116/64, pulse 71, temperature 98.3 F (36.8 C), temperature source Oral, resp. rate 18, height  (1.727 m), weight 99.338 kg (219 lb), last menstrual period 02/18/2014, SpO2 97 %, unknown if currently breastfeeding.  Physical Exam:  General: alert, cooperative and no distress Lungs: CTA bilaterally Lochia: appropriate Uterine Fundus: firm Abdomen: soft, tender in lower quadrants, normoactive bowel sounds Incision: N/A DVT Evaluation: No evidence of DVT seen on physical exam. Negative Homan's sign. No significant calf/ankle edema.   Recent Labs  11/18/14 1740  HGB 10.5*  HCT 34.1*    Assessment/Plan: PPD#1 SVD at 0131 at [redacted]w[redacted]d gestation. No complications during pregnancy or postpartum.  Plan for discharge tomorrow  GBS positive and prophylaxed with ampicillin Lightheadedness: likely from being tired; BP and HGB stable: Boy/br/LARC? Discussed contraceptive options including side effects. Patient most interested in LARC at this time, as previously did not tolerate depo well and doesn't think she can remember to take a pill at the same time every day. Provide information packet on LARC F/U at [facility] for  postpartum visit in 4-6 weeks    LOS: 2 days   Arliss Journey 11/20/2014, 7:56 AM

## 2014-11-20 NOTE — Lactation Note (Signed)
This note was copied from the chart of Amber Gretchen Weinfeld. Lactation Consultation Note Follow up visit at 39 hours of age.  Mom reports being ready for discharge, baby fussy in carseat.  Mom complains of some nipple pain, but nipples appear WNL.  ENcouraged mom to wait for wide open mouth before latch.  Mom is ready to feed baby again in football hold on couch.  Mom used pillow support and does better waiting for wide open mouth and reports comfort with latch. Discharge teaching discussed.  Mom has hand pump and aware to use as needed.  Encouraged mom to be aware of feeding frequency and voids and stools after discharge.    Patient Name: Amber Newton [id number] Date: 11/20/2014 Reason for consult: Follow-up assessment   Maternal Data    Feeding    LATCH Score/Interventions Latch: Grasps breast easily, tongue down, lips flanged, rhythmical sucking.  Audible Swallowing: Spontaneous and intermittent  Type of Nipple: Everted at rest and after stimulation  Comfort (Breast/Nipple): Soft / non-tender     Hold (Positioning): Assistance needed to correctly position infant at breast and maintain latch. Intervention(s): Breastfeeding basics reviewed;Support Pillows;Position options  LATCH Score: 9  Lactation Tools Discussed/Used     Consult Status Consult Status: Complete    Shoptaw, Arvella Merles 11/20/2014, 5:10 PM

## 2014-11-20 NOTE — Discharge Instructions (Signed)

## 2014-11-20 NOTE — Discharge Summary (Signed)
Obstetric Discharge Summary Reason for Admission: onset of labor Prenatal Procedures: none Intrapartum Procedures: spontaneous vaginal delivery and GBS prophylaxis Postpartum Procedures: none Complications-Operative and Postpartum: none  At 1:31 AM a viable female was delivered via Vaginal, Spontaneous Delivery (Presentation: Right Occiput Anterior). APGAR: 8, 9; weight pending .  Placenta status: Intact, Spontaneous. Cord: 3 vessels with the following complications: Loose nuchal cord x1, easily reduced. Cord pH: N/A  Anesthesia: Epidural  Episiotomy: None Lacerations: none Suture Repair: none Est. Blood Loss (mL): 100   Mom to postpartum. Baby to Couplet care / Skin to Skin.   Hospital Course:  Active Problems:   Normal labor and delivery   Amber Newton is a 29 y.o. [id number] s/p SVD.  Patient was admitted 7/20.  She has postpartum course that was complicated by postpartum uterine cramping w/ breastfeeding. The pt feels ready to go home and  will be discharged with outpatient follow-up.   Today: Amber Newton is a 29 y.o. female [id number] PPD#1 SVD at 0131 at [redacted]w[redacted]d gestation. up ad lib, voiding, tolerating PO and + flatus. No BM yet. Bleeding is improving, no N/V, no dizziness. Having occasional 10/10 crampy lower abdominal pain, worse when breastfeeding. Percocet will lower the pain to 6/10 pain. No acute problems overnight except for crampy pain. Breastfeeding well except for some tenderness around nipples. Doing combination breast/bottle feeding as milk is coming in, but planning on exclusively breastfeeding. Having some lightheadedness that started this morning that she thinks may be from being tired. Doesn't want permanent contraception (BTL) at this time.   Physical Exam:  General: alert, cooperative and appears stated age 77: appropriate Uterine Fundus: firm DVT Evaluation: No evidence of DVT seen on physical exam.  H/H: Lab Results  Component Value Date/Time   HGB  10.5* 11/18/2014 05:40 PM   HCT 34.1* 11/18/2014 05:40 PM    Discharge Diagnoses: Term Pregnancy-delivered  Discharge Information: Date: 11/20/2014 Activity: pelvic rest Diet: routine  Medications: Percocet Breast feeding:  Yes Condition: stable Instructions: refer to handout Discharge to: home       Discharge Instructions    Discharge patient    Complete by:  As directed             Medication List    TAKE these medications        docusate sodium 100 MG capsule  Commonly known as:  COLACE  Take 1 capsule (100 mg total) by mouth 2 (two) times daily as needed.     ferrous sulfate 325 (65 FE) MG tablet  Take 1 tablet (325 mg total) by mouth daily.     oxyCODONE-acetaminophen 5-325 MG per tablet  Commonly known as:  PERCOCET/ROXICET  Take 1 tablet by mouth every 4 (four) hours as needed for moderate pain or severe pain.     prenatal multivitamin Tabs tablet  Take 1 tablet by mouth daily at 12 noon.     ranitidine 150 MG tablet  Commonly known as:  ZANTAC  Take 1 tablet (150 mg total) by mouth 2 (two) times daily.       Follow-up Information    Follow up with [facility] In 6 weeks.   Why:  for postpartum visit      Lowanda Foster ,MD 11/20/2014,2:53 PM    I have seen and examined this patient and agree the above assessment.  Respiratory effort normal, lochia appropriate, legs negative,  pain level normal.  Amber Newton,Amber Newton 11/25/2014 9:01 AM

## 2014-11-24 NOTE — Discharge Summary (Signed)
Obstetric Discharge Summary Reason for Admission: onset of labor Prenatal Procedures: none Intrapartum Procedures: spontaneous vaginal delivery and GBS prophylaxis Postpartum Procedures: none Complications-Operative and Postpartum: none  At 1:31 AM a viable female was delivered via Vaginal, Spontaneous Delivery (Presentation: Right Occiput Anterior). APGAR: 8, 9; weight pending .  Placenta status: Intact, Spontaneous. Cord: 3 vessels with the following complications: Loose nuchal cord x1, easily reduced. Cord pH: N/A  Anesthesia: Epidural  Episiotomy: None Lacerations: none Suture Repair: none Est. Blood Loss (mL): 100   Mom to postpartum. Baby to Couplet care / Skin to Skin.   Hospital Course:  Active Problems:  Normal labor and delivery   Amber Newton is a 29 y.o. [id number] s/p SVD. Patient was admitted 7/20. She has postpartum course that was complicated by postpartum uterine cramping w/ breastfeeding. The pt feels ready to go home and will be discharged with outpatient follow-up.   Today: Amber Newton is a 29 y.o. female [id number] PPD#1 SVD at 0131 at [redacted]w[redacted]d gestation. up ad lib, voiding, tolerating PO and + flatus. No BM yet. Bleeding is improving, no N/V, no dizziness. Having occasional 10/10 crampy lower abdominal pain, worse when breastfeeding. Percocet will lower the pain to 6/10 pain. No acute problems overnight except for crampy pain. Breastfeeding well except for some tenderness around nipples. Doing combination breast/bottle feeding as milk is coming in, but planning on exclusively breastfeeding. Having some lightheadedness that started this morning that she thinks may be from being tired. Doesn't want permanent contraception (BTL) at this time.   Physical Exam:  General: alert, cooperative and appears stated age 84: appropriate Uterine Fundus: firm DVT Evaluation: No evidence of DVT seen on physical exam.  H/H: Lab Results  Component Value Date/Time    HGB 10.5* 11/18/2014 05:40 PM   HCT 34.1* 11/18/2014 05:40 PM    Discharge Diagnoses: Term Pregnancy-delivered  Discharge Information: Date: 11/20/2014 Activity: pelvic rest Diet: routine  Medications: Percocet Breast feeding: Yes Condition: stable Instructions: refer to handout Discharge to: home       Discharge Instructions    Discharge patient  Complete by: As directed             Medication List    TAKE these medications       docusate sodium 100 MG capsule  Commonly known as: COLACE  Take 1 capsule (100 mg total) by mouth 2 (two) times daily as needed.     ferrous sulfate 325 (65 FE) MG tablet  Take 1 tablet (325 mg total) by mouth daily.     oxyCODONE-acetaminophen 5-325 MG per tablet  Commonly known as: PERCOCET/ROXICET  Take 1 tablet by mouth every 4 (four) hours as needed for moderate pain or severe pain.     prenatal multivitamin Tabs tablet  Take 1 tablet by mouth daily at 12 noon.     ranitidine 150 MG tablet  Commonly known as: ZANTAC  Take 1 tablet (150 mg total) by mouth 2 (two) times daily.       Follow-up Information    Follow up with [facility] In 6 weeks.   Why: for postpartum visit      Lowanda Foster ,MD 11/20/2014,2:53 PM         Note Info   Discharge Summaries by Lynnae Prude, MD at 11/20/2014 2:33 PM    Author: Lynnae Prude, MD Service: Obstetrics Author Type: Resident   Filed: 11/20/2014 2:53 PM Note Time: 11/20/2014 2:33 PM Note Type: Discharge Summaries   Status: Cosign  Needed Editor: Lynnae Prude, MD (Resident)   Cosign Required: Yes       Obstetric Discharge Summary Reason for Admission: onset of labor Prenatal Procedures: none Intrapartum Procedures: spontaneous vaginal delivery and GBS prophylaxis Postpartum Procedures: none Complications-Operative and Postpartum: none  At 1:31 AM a viable female was delivered  via Vaginal, Spontaneous Delivery (Presentation: Right Occiput Anterior). APGAR: 8, 9; weight pending .  Placenta status: Intact, Spontaneous. Cord: 3 vessels with the following complications: Loose nuchal cord x1, easily reduced. Cord pH: N/A  Anesthesia: Epidural  Episiotomy: None Lacerations: none Suture Repair: none Est. Blood Loss (mL): 100   Mom to postpartum. Baby to Couplet care / Skin to Skin.   Hospital Course:  Active Problems:  Normal labor and delivery   Amber Newton is a 29 y.o. [id number] s/p SVD. Patient was admitted 7/20. She has postpartum course that was complicated by postpartum uterine cramping w/ breastfeeding. The pt feels ready to go home and will be discharged with outpatient follow-up.   Today: Amber Newton is a 29 y.o. female [id number] PPD#1 SVD at 0131 at [redacted]w[redacted]d gestation. up ad lib, voiding, tolerating PO and + flatus. No BM yet. Bleeding is improving, no N/V, no dizziness. Having occasional 10/10 crampy lower abdominal pain, worse when breastfeeding. Percocet will lower the pain to 6/10 pain. No acute problems overnight except for crampy pain. Breastfeeding well except for some tenderness around nipples. Doing combination breast/bottle feeding as milk is coming in, but planning on exclusively breastfeeding. Having some lightheadedness that started this morning that she thinks may be from being tired. Doesn't want permanent contraception (BTL) at this time.   Physical Exam:  General: alert, cooperative and appears stated age 40: appropriate Uterine Fundus: firm DVT Evaluation: No evidence of DVT seen on physical exam.  H/H: Lab Results  Component Value Date/Time   HGB 10.5* 11/18/2014 05:40 PM   HCT 34.1* 11/18/2014 05:40 PM    Discharge Diagnoses: Term Pregnancy-delivered  Discharge Information: Date: 11/20/2014 Activity: pelvic rest Diet: routine  Medications: Percocet Breast feeding: Yes Condition: stable Instructions:  refer to handout Discharge to: home       Discharge Instructions    Discharge patient  Complete by: As directed             Medication List    TAKE these medications       docusate sodium 100 MG capsule  Commonly known as: COLACE  Take 1 capsule (100 mg total) by mouth 2 (two) times daily as needed.     ferrous sulfate 325 (65 FE) MG tablet  Take 1 tablet (325 mg total) by mouth daily.     oxyCODONE-acetaminophen 5-325 MG per tablet  Commonly known as: PERCOCET/ROXICET  Take 1 tablet by mouth every 4 (four) hours as needed for moderate pain or severe pain.     prenatal multivitamin Tabs tablet  Take 1 tablet by mouth daily at 12 noon.     ranitidine 150 MG tablet  Commonly known as: ZANTAC  Take 1 tablet (150 mg total) by mouth 2 (two) times daily.       Follow-up Information    Follow up with [facility] In 6 weeks.   Why: for postpartum visit      Lowanda Foster ,MD 11/20/2014,2:53 PM

## 2015-01-13 ENCOUNTER — Other Ambulatory Visit: Payer: Self-pay | Admitting: Obstetrics & Gynecology

## 2015-01-13 ENCOUNTER — Encounter: Payer: Self-pay | Admitting: Obstetrics & Gynecology

## 2015-01-13 ENCOUNTER — Ambulatory Visit (INDEPENDENT_AMBULATORY_CARE_PROVIDER_SITE_OTHER): Payer: Medicaid Other | Admitting: Obstetrics & Gynecology

## 2015-01-13 VITALS — BP 130/78 | HR 79 | Ht 68.5 in | Wt 195.0 lb

## 2015-01-13 DIAGNOSIS — F53 Postpartum depression: Secondary | ICD-10-CM | POA: Insufficient documentation

## 2015-01-13 DIAGNOSIS — Z113 Encounter for screening for infections with a predominantly sexual mode of transmission: Secondary | ICD-10-CM

## 2015-01-13 DIAGNOSIS — N912 Amenorrhea, unspecified: Secondary | ICD-10-CM | POA: Diagnosis not present

## 2015-01-13 DIAGNOSIS — O99345 Other mental disorders complicating the puerperium: Secondary | ICD-10-CM

## 2015-01-13 LAB — POCT URINE PREGNANCY: Preg Test, Ur: NEGATIVE

## 2015-01-13 MED ORDER — SERTRALINE HCL 50 MG PO TABS: 50.0000 mg | ORAL_TABLET | Freq: Every day | ORAL | Status: AC

## 2015-01-13 NOTE — Progress Notes (Signed)
  Subjective:     Amber Newton is a 29 y.o. female who presents for a postpartum visit. She is 8 weeks postpartum following a spontaneous vaginal delivery. I have fully reviewed the prenatal and intrapartum course. The delivery was at 39 gestational weeks. Outcome: spontaneous vaginal delivery. Anesthesia: epidural. Postpartum course has been complicated by pp depression. Baby's course has been uncomplicated. Baby is feeding by breast. Bleeding no bleeding. Bowel function is normal. Bladder function is normal. Patient is sexually active. Contraception method is none. Postpartum depression screening: positive.  The following portions of the patient's history were reviewed and updated as appropriate: allergies, current medications, past family history, past medical history, past social history, past surgical history and problem list.  Review of Systems Pertinent items are noted in HPI.   Objective:    BP 130/78 mmHg  Pulse 79  Ht 5' 8.5" (1.74 m)  Wt 195 lb (88.451 kg)  BMI 29.21 kg/m2  General:  alert, cooperative and no distress   Breasts:  inspection negative, no nipple discharge or bleeding, no masses or nodularity palpable and one area of engorgement--no evidence of mastititis.  Lungs: clear to auscultation bilaterally  Heart:  regular rate and rhythm  Abdomen: soft, non-tender; bowel sounds normal; no masses,  no organomegaly   Vulva:  normal  Vagina: normal vagina  Cervix:  no cervical motion tenderness and no lesions  Corpus: normal size, contour, position, consistency, mobility, non-tender  Adnexa:  normal adnexa  Rectal Exam: Not performed.        Assessment:     7-8 postpartum exam.  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 Contraception: IUD 2. Zoloft and referral to Behavioral health for counseling 3. Encouraged adult interactions 4. Follow up in: 2 weeks or as needed.   5.  Mirena IUD; neg upt today and needs rpt in 2 weeks.  Abstain between. 6.  Cultures  today.

## 2015-01-13 NOTE — Progress Notes (Deleted)
Patient ID: Amber Newton, female   DOB: 02-11-1986, 29 y.o.   MRN: [id number]

## 2015-01-14 LAB — GC/CHLAMYDIA PROBE AMP
CT Probe RNA: NEGATIVE
GC Probe RNA: NEGATIVE

## 2015-01-14 LAB — TSH: TSH: 1.312 u[IU]/mL (ref 0.350–4.500)

## 2015-01-15 ENCOUNTER — Telehealth: Payer: Self-pay

## 2015-01-15 NOTE — Telephone Encounter (Signed)
-----   Message from Lesly Dukes, MD sent at 01/15/2015  5:52 AM EDT ----- Call the patient and let them know their lab results are normal.  Thanks!!

## 2015-01-15 NOTE — Telephone Encounter (Signed)
Left message for patient that all results were normal. Armandina Stammer RN BSN

## 2015-02-03 ENCOUNTER — Ambulatory Visit (INDEPENDENT_AMBULATORY_CARE_PROVIDER_SITE_OTHER): Payer: Medicaid Other | Admitting: Obstetrics & Gynecology

## 2015-02-03 ENCOUNTER — Encounter: Payer: Self-pay | Admitting: Obstetrics & Gynecology

## 2015-02-03 VITALS — BP 123/77 | HR 87 | Resp 16 | Ht 67.0 in | Wt 203.0 lb

## 2015-02-03 DIAGNOSIS — Z30011 Encounter for initial prescription of contraceptive pills: Secondary | ICD-10-CM | POA: Diagnosis not present

## 2015-02-03 DIAGNOSIS — Z01812 Encounter for preprocedural laboratory examination: Secondary | ICD-10-CM

## 2015-02-03 MED ORDER — NORETHIN-ETH ESTRAD-FE BIPHAS 1 MG-10 MCG / 10 MCG PO TABS: 1.0000 | ORAL_TABLET | Freq: Every day | ORAL | Status: DC

## 2015-02-03 NOTE — Progress Notes (Signed)
   Subjective:    Patient ID: Hurshel Party, female    DOB: 1986/04/28, 29 y.o.   MRN: [id number]  HPI  29 yo MAA P3 here to discuss contraception. She was intially here for a Mirena but has decided that she would rather restart OCPs. She is weaning her child now. Using condoms currently.  Review of Systems She is not currently feeling depressed, on zoloft.     Objective:   Physical Exam [id number] Breathing, conversing, ambulating normally       Assessment & Plan:  Contraception and desire for menstrual regulation- She is weaning her baby and understands that OCPs will decrease milk supply. Trial of lo lo estrin. Back up for a month. Declines flu vaccine

## 2015-03-10 ENCOUNTER — Ambulatory Visit: Payer: Medicaid Other | Admitting: Obstetrics & Gynecology

## 2015-03-31 ENCOUNTER — Encounter: Payer: Medicaid Other | Admitting: *Deleted

## 2015-05-27 ENCOUNTER — Other Ambulatory Visit: Payer: Self-pay | Admitting: Obstetrics & Gynecology

## 2015-05-27 ENCOUNTER — Other Ambulatory Visit (INDEPENDENT_AMBULATORY_CARE_PROVIDER_SITE_OTHER): Payer: Medicaid Other

## 2015-05-27 ENCOUNTER — Ambulatory Visit: Payer: Medicaid Other | Admitting: Obstetrics & Gynecology

## 2015-05-27 VITALS — BP 125/83 | HR 73 | Resp 16

## 2015-05-27 DIAGNOSIS — N912 Amenorrhea, unspecified: Secondary | ICD-10-CM | POA: Diagnosis not present

## 2015-05-27 DIAGNOSIS — R350 Frequency of micturition: Secondary | ICD-10-CM

## 2015-05-27 LAB — POCT URINE PREGNANCY: Preg Test, Ur: NEGATIVE

## 2015-05-28 LAB — GC/CHLAMYDIA PROBE AMP
CT PROBE, AMP APTIMA: NOT DETECTED
GC PROBE AMP APTIMA: NOT DETECTED

## 2015-05-30 ENCOUNTER — Other Ambulatory Visit: Payer: Self-pay | Admitting: Obstetrics & Gynecology

## 2015-05-30 LAB — CULTURE, URINE COMPREHENSIVE: Colony Count: >=100000

## 2015-05-30 MED ORDER — SULFAMETHOXAZOLE-TRIMETHOPRIM 800-160 MG PO TABS: 1.0000 | ORAL_TABLET | Freq: Two times a day (BID) | ORAL | Status: DC

## 2015-05-30 NOTE — Progress Notes (Signed)
E coli UTI.  Bactrim DS prescribed.

## 2015-06-16 ENCOUNTER — Encounter: Payer: Self-pay | Admitting: Obstetrics & Gynecology

## 2015-06-16 ENCOUNTER — Ambulatory Visit (INDEPENDENT_AMBULATORY_CARE_PROVIDER_SITE_OTHER): Payer: Medicaid Other | Admitting: Obstetrics & Gynecology

## 2015-06-16 VITALS — BP 130/79 | HR 80 | Resp 16 | Wt 224.0 lb

## 2015-06-16 DIAGNOSIS — Z124 Encounter for screening for malignant neoplasm of cervix: Secondary | ICD-10-CM

## 2015-06-16 DIAGNOSIS — Z3202 Encounter for pregnancy test, result negative: Secondary | ICD-10-CM

## 2015-06-16 DIAGNOSIS — Z0000 Encounter for general adult medical examination without abnormal findings: Secondary | ICD-10-CM | POA: Diagnosis not present

## 2015-06-16 DIAGNOSIS — Z3009 Encounter for other general counseling and advice on contraception: Secondary | ICD-10-CM

## 2015-06-16 DIAGNOSIS — Z1151 Encounter for screening for human papillomavirus (HPV): Secondary | ICD-10-CM

## 2015-06-16 DIAGNOSIS — Z3049 Encounter for surveillance of other contraceptives: Secondary | ICD-10-CM

## 2015-06-16 DIAGNOSIS — Z01419 Encounter for gynecological examination (general) (routine) without abnormal findings: Secondary | ICD-10-CM

## 2015-06-16 LAB — POCT URINALYSIS DIPSTICK
BILIRUBIN UA: NEGATIVE
Glucose, UA: NEGATIVE
Ketones, UA: NEGATIVE
Leukocytes, UA: NEGATIVE
Nitrite, UA: NEGATIVE
PH UA: 6.5
PROTEIN UA: NEGATIVE
RBC UA: NEGATIVE
SPEC GRAV UA: 1.015
Urobilinogen, UA: 0.2

## 2015-06-16 LAB — POCT URINE PREGNANCY: Preg Test, Ur: NEGATIVE

## 2015-06-16 NOTE — Progress Notes (Signed)
  Subjective:     Amber Newton is a 30 y.o. female here for a routine exam.  Current complaints: had her period during breast feeding initially and no stopped past 2 months.  Pt is exclusively breast feeding.  Not on OCP and UPT negative here and multiple times at home..    Gynecologic History Patient's last menstrual period was 04/25/2015. Contraception: coitus interruptus Last Pap: 06/2014. Results were: normal [facility] with negative biopsy.   Obstetric History OB History  Gravida Para Term Preterm AB SAB TAB Ectopic Multiple Living  0 1 1 0 0 0 3    # Outcome Date GA Lbr Len/2nd Weight Sex Delivery Anes PTL Lv  4 Term 11/19/14 [redacted]w[redacted]d 10:01 / 01:00 8 lb 2.6 oz (3.702 kg) M Vag-Spont EPI  Y  3 Term 09/23/13 [redacted]w[redacted]d 08:33 / 01:57 8 lb 8.8 oz (3.878 kg) M Vag-Spont EPI  Y  2 SAB           1 Term     M Vag-Spont   Y       The following portions of the patient's history were reviewed and updated as appropriate: allergies, current medications, past family history, past medical history, past social history, past surgical history and problem list.  Review of Systems Pertinent items noted in HPI and remainder of comprehensive ROS otherwise negative.    Objective:      Filed Vitals:   06/16/15 0915  BP: 130/79  Pulse: 80  Resp: 16  Weight: 224 lb (101.606 kg)   Vitals:  WNL General appearance: alert, cooperative and no distress  HEENT: Normocephalic, without obvious abnormality, atraumatic Eyes: negative Throat: lips, mucosa, and tongue normal; teeth and gums normal  Respiratory: Clear to auscultation bilaterally  CV: Regular rate and rhythm  Breasts:  Normal appearance, no masses or tenderness, no nipple retraction or dimpling  GI: Soft, non-tender; bowel sounds normal; no masses,  no organomegaly  GU: External Genitalia:  Tanner V, no lesion Urethra:  No prolapse   Vagina: Pink, normal rugae, no blood or discharge (very long  vagina-)  Cervix: No CMT, no lesion  Uterus:  Normal size and contour, non tender  Adnexa: Normal, no masses, non tender  Musculoskeletal: No edema, redness or tenderness in the calves or thighs  Skin: No lesions or rash  Lymphatic: Axillary adenopathy: none    Psychiatric: Normal mood and behavior           Assessment:    Healthy female exam.    Plan:    Education reviewed: self breast exams and skin cancer screening. Contraception: coitus interruptus. Follow up in: 1 year.    PNV daily as patient would not mind if she becomes pregnant. Menses should return on breast feeding cessation.  Contact us if not.

## 2015-06-16 NOTE — Addendum Note (Signed)
Addended by: Etta Grandchild on: 06/16/2015 10:54 AM   Modules accepted: Kipp Brood

## 2015-11-25 ENCOUNTER — Encounter: Payer: Medicaid Other | Admitting: Obstetrics & Gynecology

## 2015-12-02 ENCOUNTER — Encounter: Payer: Medicaid Other | Admitting: Obstetrics & Gynecology

## 2016-01-31 ENCOUNTER — Encounter: Payer: Self-pay | Admitting: Advanced Practice Midwife

## 2016-01-31 ENCOUNTER — Ambulatory Visit (INDEPENDENT_AMBULATORY_CARE_PROVIDER_SITE_OTHER): Payer: Medicaid Other | Admitting: Advanced Practice Midwife

## 2016-01-31 VITALS — BP 124/86 | Wt 213.0 lb

## 2016-01-31 DIAGNOSIS — Z3482 Encounter for supervision of other normal pregnancy, second trimester: Secondary | ICD-10-CM | POA: Diagnosis not present

## 2016-01-31 DIAGNOSIS — H539 Unspecified visual disturbance: Secondary | ICD-10-CM | POA: Diagnosis not present

## 2016-01-31 DIAGNOSIS — R399 Unspecified symptoms and signs involving the genitourinary system: Secondary | ICD-10-CM

## 2016-01-31 MED ORDER — CONCEPT OB 130-92.4-1 MG PO CAPS: 1.0000 | ORAL_CAPSULE | Freq: Every day | ORAL | 12 refills | Status: AC

## 2016-01-31 NOTE — Patient Instructions (Signed)
Visual Disturbances °You have had a disturbance in your vision. This may be caused by various conditions, such as: °· Migraines. Migraine headaches are often preceded by a disturbance in vision. Blind spots or light flashes are followed by a headache. This type of visual disturbance is temporary. It does not damage the eye. °· Glaucoma. This is caused by increased pressure in the eye. Symptoms include haziness, blurred vision, or seeing rainbow colored circles when looking at bright lights. Partial or complete visual loss can occur. You may or may not experience eye pain. Visual loss may be gradual or sudden and is irreversible. Glaucoma is the leading cause of blindness. °· Retina problems. Vision will be reduced if the retina becomes detached or if there is a circulation problem as with diabetes, high blood pressure, or a mini-stroke. Symptoms include seeing "floaters," flashes of light, or shadows, as if a curtain has fallen over your eye. °· Optic nerve problems. The main nerve in your eye can be damaged by redness, soreness, and swelling (inflammation), poor circulation, drugs, and toxins. °It is very important to have a complete exam done by a specialist to determine the exact cause of your eye problem. The specialist may recommend medicines or surgery, depending on the cause of the problem. This can help prevent further loss of vision or reduce the risk of having a stroke. Contact the caregiver to whom you have been referred and arrange for follow-up care right away. °SEEK IMMEDIATE MEDICAL CARE IF:  °· Your vision gets worse. °· You develop severe headaches. °· You have any weakness or numbness in the face, arms, or legs. °· You have any trouble speaking or walking. °  °This information is not intended to replace advice given to you by your health care provider. Make sure you discuss any questions you have with your health care provider. °  °Document Released: 05/25/2004 Document Revised: 07/10/2011 Document  Reviewed: 09/24/2013 °Elsevier Interactive Patient Education ©2016 [employer] ° °

## 2016-01-31 NOTE — Progress Notes (Signed)
   PRENATAL VISIT NOTE  Subjective:  Amber Newton is a 30 y.o. [phone] at 5226w4d being seen today [facility] Thanks she had routine labs, Pap, genetic screening and anatomy US showing female fetus (but was measuring S>D, 16.4 weeks instead on 19 weeks). Has F/U US scheduled Oct 23rd and would like to keep that appt. US on 01/24/16 visible in Care Everywhere, but not read yet. She is currently monitored for the following issues for this low-risk pregnancy and has GERD and HEADACHE on her problem list.  Patient reports occassional camping and right field of vision intermittently being "whited out" over the past two weeks. No Hx similar Sx. No associated Sx. Specifically no HA, weakness, difficulties w/ speech or gait. Has Hx Migraines, but never had an aura w/ them. No HA's since the vision problems began. Does not have PCP.  Contractions: Irritability. Vag. Bleeding: None.  Movement: Present. Denies leaking of fluid.   The following portions of the patient's history were reviewed and updated as appropriate: allergies, current medications, past family history, past medical history, past social history, past surgical history and problem list. Problem list updated.  Objective:   Vitals:   01/31/16 1002  BP: 124/86  Weight: 213 lb (96.6 kg)    Fetal Status: Fetal Heart Rate (bpm): 149 Fundal Height: 18 cm Movement: Present     General: Alert, oriented and cooperative. Patient is in no acute distress.  HEENT:  Grossly normal vision. Grossly normal appearance of eyes. No facial droop.  Skin: Skin is warm and dry. No rash noted.   Cardiovascular: Normal heart rate noted  Respiratory: Normal respiratory effort, no problems with respiration noted  Neuro: A&Ox4. Normal speech and gait. Symmetric facial mvmts.   Abdomen: Soft, gravid, appropriate for gestational age. Pain/Pressure: Present     Pelvic:  Cervical exam performed Dilation: Closed Effacement (%): 0  Station: -3  Extremities: Normal range of motion.  Edema: None  Mental Status: Normal mood and affect. Normal behavior. Normal judgment and thought content.   Urinalysis: Urine Protein: Trace Urine Glucose: Negative  Assessment and Plan:  Pregnancy: [id number] at 4826w4d  1. Encounter for supervision of other normal pregnancy in second trimester  - AFP, Quad Screen--Will re-draw since she had EDD changed after blood work at previous practice.  - AFP/Quad Scr - Prenatal (OB Panel) - [facility]   2. 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 Vergie LivingPickens - CT HEAD WO CONTRAST; Future  - Ambulatory referral to Neurology - Stroke precautions.   Preterm labor symptoms and general obstetric precautions including but not limited to vaginal bleeding, contractions, leaking of fluid and fetal movement were reviewed in detail with the patient. Please refer to After Visit Summary for other counseling recommendations.  F/U 4 weeks  Dorathy KinsmanVirginia Raymund Manrique, [place]

## 2016-02-01 LAB — AFP, QUAD SCREEN
AFP: 26.8 ng/mL
Curr Gest Age: 17.6 weeks
Down Syndrome Scr Risk Est: 1:3520 {titer}
HCG TOTAL: 15.48 [IU]/mL
INH: 170.9 pg/mL
INTERPRETATION-AFP: NEGATIVE
MOM FOR AFP: 0.72
MOM FOR INH: 1.19
MoM for hCG: 0.62
OPEN SPINA BIFIDA: NEGATIVE
Osb Risk: <1:54600 {titer}
TRI 18 SCR RISK EST: NEGATIVE
Trisomy 18 (Edward) Syndrome Interp.: 1:23400 {titer}
UE3 MOM: 1.19
uE3 Value: 1.29 ng/mL

## 2016-02-02 DIAGNOSIS — H539 Unspecified visual disturbance: Secondary | ICD-10-CM | POA: Insufficient documentation

## 2016-02-02 DIAGNOSIS — Z3490 Encounter for supervision of normal pregnancy, unspecified, unspecified trimester: Secondary | ICD-10-CM | POA: Insufficient documentation

## 2016-02-02 LAB — URINE CULTURE

## 2016-02-15 ENCOUNTER — Encounter: Payer: Self-pay | Admitting: *Deleted

## 2016-02-15 NOTE — Addendum Note (Signed)
Addended by: Mariel AloeLARK, Keagon Glascoe L on: 02/15/2016 11:00 AM   Modules accepted: Orders

## 2016-03-02 ENCOUNTER — Ambulatory Visit: Payer: Medicaid Other | Admitting: Neurology

## 2016-03-03 ENCOUNTER — Ambulatory Visit (INDEPENDENT_AMBULATORY_CARE_PROVIDER_SITE_OTHER): Payer: Medicaid Other | Admitting: Advanced Practice Midwife

## 2016-03-03 VITALS — BP 102/63 | HR 70 | Wt 214.0 lb

## 2016-03-03 DIAGNOSIS — O26892 Other specified pregnancy related conditions, second trimester: Secondary | ICD-10-CM

## 2016-03-03 DIAGNOSIS — H539 Unspecified visual disturbance: Secondary | ICD-10-CM

## 2016-03-03 DIAGNOSIS — Z3482 Encounter for supervision of other normal pregnancy, second trimester: Secondary | ICD-10-CM

## 2016-03-03 DIAGNOSIS — O26899 Other specified pregnancy related conditions, unspecified trimester: Secondary | ICD-10-CM

## 2016-03-03 DIAGNOSIS — R102 Pelvic and perineal pain: Secondary | ICD-10-CM

## 2016-03-03 NOTE — Patient Instructions (Signed)

## 2016-03-03 NOTE — Progress Notes (Signed)
   PRENATAL VISIT NOTE  Subjective:  Amber Newton is a 30 y.o. G5P3013 at 288w1d being seen today for ongoing prenatal care.  She is currently monitored for the following issues for this low-risk pregnancy and has GERD; HEADACHE; Supervision of normal pregnancy; and Vision changes on her problem list.  Patient reports round ligament pain, continued visual disturbances that are intermittent, occasional h/a.Marland Kitchen.  Contractions: Irritability. Vag. Bleeding: None.  Movement: Present. Denies leaking of fluid.   The following portions of the patient's history were reviewed and updated as appropriate: allergies, current medications, past family history, past medical history, past social history, past surgical history and problem list. Problem list updated.  Objective:   Vitals:   03/03/16 1010  BP: 102/63  Pulse: 70  Weight: 214 lb (97.1 kg)    Fetal Status: Fetal Heart Rate (bpm): 151   Movement: Present     General:  Alert, oriented and cooperative. Patient is in no acute distress.  Skin: Skin is warm and dry. No rash noted.   Cardiovascular: Normal heart rate noted  Respiratory: Normal respiratory effort, no problems with respiration noted  Abdomen: Soft, gravid, appropriate for gestational age. Pain/Pressure: Absent     Pelvic:  Cervical exam deferred        Extremities: Normal range of motion.  Edema: None  Mental Status: Normal mood and affect. Normal behavior. Normal judgment and thought content.   Assessment and Plan:  Pregnancy: G5P3013 at 288w1d  1. Encounter for supervision of other normal pregnancy in second trimester --Anticipatory guidance about next weeks of pregnancy and upcoming prenatal visits given.  2. Vision changes --Pt still experiencing intermittent vision changes, floaters, eyes tearing up.  CT scan in January.  Has not had eye exam in a few years and does not wear glasses she was prescribed. Recommend routine eye exam as soon as possible.    3. Pain of round  ligament affecting pregnancy, antepartum --Rest/ice/heat/warm bath/Tylenol/Increased PO fluids/Pregnancy support belt for pain.  Preterm labor symptoms and general obstetric precautions including but not limited to vaginal bleeding, contractions, leaking of fluid and fetal movement were reviewed in detail with the patient. Please refer to After Visit Summary for other counseling recommendations.  Return in about 4 weeks (around 03/31/2016).   Amber PartyLisa A Leftwich-Kirby, CNM

## 2016-03-31 ENCOUNTER — Ambulatory Visit (INDEPENDENT_AMBULATORY_CARE_PROVIDER_SITE_OTHER): Payer: Medicaid Other | Admitting: Family

## 2016-03-31 ENCOUNTER — Encounter: Payer: Self-pay | Admitting: Family

## 2016-03-31 ENCOUNTER — Encounter: Payer: Self-pay | Admitting: *Deleted

## 2016-03-31 VITALS — BP 119/71 | HR 72 | Wt 214.0 lb

## 2016-03-31 DIAGNOSIS — O219 Vomiting of pregnancy, unspecified: Secondary | ICD-10-CM

## 2016-03-31 DIAGNOSIS — Z3482 Encounter for supervision of other normal pregnancy, second trimester: Secondary | ICD-10-CM

## 2016-03-31 DIAGNOSIS — Z3492 Encounter for supervision of normal pregnancy, unspecified, second trimester: Secondary | ICD-10-CM

## 2016-03-31 LAB — CBC
HCT: 38.2 % (ref 35.0–45.0)
HEMOGLOBIN: 12.5 g/dL (ref 11.7–15.5)
MCH: 27.2 pg (ref 27.0–33.0)
MCHC: 32.7 g/dL (ref 32.0–36.0)
MCV: 83.2 fL (ref 80.0–100.0)
MPV: 10.1 fL (ref 7.5–12.5)
Platelets: 246 10*3/uL (ref 140–400)
RBC: 4.59 MIL/uL (ref 3.80–5.10)
RDW: 13.9 % (ref 11.0–15.0)
WBC: 5.8 10*3/uL (ref 3.8–10.8)

## 2016-03-31 NOTE — Patient Instructions (Signed)
Thinking About Doren Custard???  You must attend a Doren Custard class at [facility]  3rd Wednesday of every month from 7-9pm  Free  [employer] by calling [phone] or online at [email or web address]  Bring [place] the certificate from the class  Doren Custard supplies needed for [facility] patients:  Our practice has a Heritage manager in a Box tub at the hospital that you can borrow  You will need to purchase an accessory kit that has all needed supplies through [facility] [phone]) or online $175.00  Or you can purchase the supplies separately: o Single-use disposable tub liner for Birth Pool in a Box (REGULAR size) - NOT INCLUDED IN ACCESSORY KIT o New garden hose labeled "lead-free", "suitable for drinking water", o Electric drain pump to remove water (We recommend 792 gallon per hour or greater pump.)  o  "non-toxic" OR "water potable" o Garden hose to remove the dirty water o Fish net o Bathing suit top (optional) o Long-handled mirror (optional)  [employer] sells tubs for ~ $120 if you would rather purchase your own tub.  They also sell accessories, liners.    Www.waterbirthsolutions.com for tub purchases and supplies  The Labor Ladies (www.thelaborladies.com) $275 for tub rental/set-up & take down/kit   [employer] information regarding doulas (labor support) who provide pool rentals:  IdentityList.se.htm   The Labor Ladies (www.thelaborladies.com)  IdentityList.se.htm   Things that would prevent you from having a waterbirth:  Premature, <37wks  Previous cesarean birth  Presence of thick meconium-stained fluid  Multiple gestation (Twins, triplets, etc.)  Uncontrolled diabetes or gestational diabetes requiring medication  Hypertension  Heavy vaginal bleeding  Non-reassuring fetal heart rate  Active infection (MRSA, etc.)  If your labor has to be induced and induction  method requires continuous monitoring of the baby's heart rate  Other risks/issues identified by your obstetrical provider

## 2016-03-31 NOTE — Progress Notes (Signed)
   PRENATAL VISIT NOTE  Subjective:  Amber PartyKelly Lords is a 30 y.o. [phone] at 7661w1d being seen today for ongoing prenatal care.  She is currently monitored for the following issues for this low-risk pregnancy and has GERD; HEADACHE; Supervision of normal pregnancy; and Vision changes on her problem list.  Patient reports continued nausea throughout the pregnancy.  Contractions: Irritability. Vag. Bleeding: None.  Movement: Present. Denies leaking of fluid.   The following portions of the patient's history were reviewed and updated as appropriate: allergies, current medications, past family history, past medical history, past social history, past surgical history and problem list. Problem list updated.  Objective:   Vitals:   03/31/16 0932  BP: 119/71  Pulse: 72  Weight: 214 lb (97.1 kg)    Fetal Status: Fetal Heart Rate (bpm): 147 Fundal Height: 28 cm Movement: Present     General:  Alert, oriented and cooperative. Patient is in no acute distress.  Skin: Skin is warm and dry. No rash noted.   Cardiovascular: Normal heart rate noted  Respiratory: Normal respiratory effort, no problems with respiration noted  Abdomen: Soft, gravid, appropriate for gestational age. Pain/Pressure: Present     Pelvic:  Cervical exam deferred        Extremities: Normal range of motion.  Edema: Trace  Mental Status: Normal mood and affect. Normal behavior. Normal judgment and thought content.   Assessment and Plan:  Pregnancy: [id number] at 2961w1d  1. Normal pregnancy in second trimester - Glucose Tolerance, 2 Hours w/1 Hour - CBC - HIV antibody (with reflex) - RPR - BTL consent papers signed today 2. Nausea and Vomiting in Pregnancy - Discussed strategies for N&V including eating crackers before rising and small snacks to avoid getting hungry  Preterm labor symptoms and general obstetric precautions including but not limited to vaginal bleeding, contractions, leaking of fluid and fetal movement were  reviewed in detail with the patient. Please refer to After Visit Summary for other counseling recommendations.  Return in about 4 weeks (around 04/28/2016).   Eino FarberWalidah Kennith GainN Karim, CNM

## 2016-04-01 LAB — HIV ANTIBODY (ROUTINE TESTING W REFLEX): HIV: NONREACTIVE

## 2016-04-01 LAB — RPR

## 2016-04-06 LAB — GLUCOSE TOLERANCE, 2 HOURS W/ 1HR
GLUCOSE, 2 HOUR: 101 mg/dL (ref <140)
Glucose, Fasting: 87 mg/dL (ref 65–99)

## 2016-04-14 ENCOUNTER — Ambulatory Visit: Payer: Medicaid Other | Admitting: Neurology

## 2016-04-27 ENCOUNTER — Ambulatory Visit (INDEPENDENT_AMBULATORY_CARE_PROVIDER_SITE_OTHER): Payer: Medicaid Other | Admitting: Obstetrics & Gynecology

## 2016-04-27 VITALS — BP 114/67 | HR 79 | Wt 219.0 lb

## 2016-04-27 DIAGNOSIS — Z3483 Encounter for supervision of other normal pregnancy, third trimester: Secondary | ICD-10-CM

## 2016-04-27 NOTE — Progress Notes (Signed)
   PRENATAL VISIT NOTE  Subjective:  Amber Newton is a 30 y.o. G5P3013 at 4158w0d being seen today for ongoing prenatal care.  She is currently monitored for the following issues for this low-risk pregnancy and has GERD; HEADACHE; Supervision of normal pregnancy; and Vision changes on her problem list.  Patient reports no complaints.  Contractions: Irritability. Vag. Bleeding: None.  Movement: Present. Denies leaking of fluid.   The following portions of the patient's history were reviewed and updated as appropriate: allergies, current medications, past family history, past medical history, past social history, past surgical history and problem list. Problem list updated.  Objective:   Vitals:   04/27/16 1353  BP: 114/67  Pulse: 79  Weight: 219 lb (99.3 kg)    Fetal Status: Fetal Heart Rate (bpm): 138   Movement: Present     General:  Alert, oriented and cooperative. Patient is in no acute distress.  Skin: Skin is warm and dry. No rash noted.   Cardiovascular: Normal heart rate noted  Respiratory: Normal respiratory effort, no problems with respiration noted  Abdomen: Soft, gravid, appropriate for gestational age. Pain/Pressure: Present     Pelvic:  Cervical exam deferred        Extremities: Normal range of motion.  Edema: Trace  Mental Status: Normal mood and affect. Normal behavior. Normal judgment and thought content.   Assessment and Plan:  Pregnancy: [id number] at 3258w0d  1. Encounter for supervision of other normal pregnancy in third trimester   Preterm labor symptoms and general obstetric precautions including but not limited to vaginal bleeding, contractions, leaking of fluid and fetal movement were reviewed in detail with the patient. Please refer to After Visit Summary for other counseling recommendations.  No Follow-up on file.   Allie BossierMyra C Jayden Kratochvil, MD

## 2016-05-01 NOTE — L&D Delivery Note (Signed)
Delivery Note Patient arrived via private car with baby in arms.  States labor started at 3am. She decided to wait to see if it would subside.  Just prior to arrival, baby delivered precipitously in car and mother put baby skin to skin.  On arrival, baby is pink and warm.  Cord cut and placenta delivered.   At 8:00 AM a viable and healthy unspecified sex was delivered via Vaginal, Spontaneous Delivery (Presentation: unknown vertex ).  APGAR: __/__/10 , ; weight  .   Placenta status: spontaneous and grossly intact with 3VC .  Cord:  with the following complications: none.    Anesthesia:  none Episiotomy: None Lacerations:  none Suture Repair: none Est. Blood Loss (mL): <100   Mom to postpartum.  Baby to Couplet care / Skin to Skin.  Amber BourgeoisMarie Arisbel Newton 06/30/2016, 8:58 AM

## 2016-05-09 ENCOUNTER — Encounter: Payer: Medicaid Other | Admitting: Obstetrics & Gynecology

## 2016-05-10 ENCOUNTER — Ambulatory Visit: Payer: Medicaid Other | Admitting: Neurology

## 2016-05-11 ENCOUNTER — Encounter: Payer: Medicaid Other | Admitting: Obstetrics & Gynecology

## 2016-05-15 ENCOUNTER — Ambulatory Visit (INDEPENDENT_AMBULATORY_CARE_PROVIDER_SITE_OTHER): Payer: Medicaid Other | Admitting: Advanced Practice Midwife

## 2016-05-15 VITALS — BP 107/70 | HR 85 | Wt 216.0 lb

## 2016-05-15 DIAGNOSIS — O479 False labor, unspecified: Secondary | ICD-10-CM

## 2016-05-15 DIAGNOSIS — O4700 False labor before 37 completed weeks of gestation, unspecified trimester: Secondary | ICD-10-CM

## 2016-05-15 DIAGNOSIS — Z3A32 32 weeks gestation of pregnancy: Secondary | ICD-10-CM

## 2016-05-15 DIAGNOSIS — O4703 False labor before 37 completed weeks of gestation, third trimester: Secondary | ICD-10-CM | POA: Diagnosis not present

## 2016-05-16 ENCOUNTER — Encounter: Payer: Self-pay | Admitting: Advanced Practice Midwife

## 2016-05-16 DIAGNOSIS — O479 False labor, unspecified: Secondary | ICD-10-CM | POA: Insufficient documentation

## 2016-05-16 DIAGNOSIS — O4700 False labor before 37 completed weeks of gestation, unspecified trimester: Secondary | ICD-10-CM | POA: Insufficient documentation

## 2016-05-16 LAB — FETAL FIBRONECTIN: Fetal Fibronectin: NEGATIVE

## 2016-05-16 NOTE — Patient Instructions (Signed)
Preterm Labor and Birth Information °Pregnancy normally lasts 39-41 weeks. Preterm labor is when labor starts early. It starts before you have been pregnant for 37 whole weeks. °What are the risk factors for preterm labor? °Preterm labor is more likely to occur in women who: °· Have an infection while pregnant. °· Have a cervix that is short. °· Have gone into preterm labor before. °· Have had surgery on their cervix. °· Are younger than age 29. °· Are older than age 35. °· Are African American. °· Are pregnant with two or more babies. °· Take street drugs while pregnant. °· Smoke while pregnant. °· Do not gain enough weight while pregnant. °· Got pregnant right after another pregnancy. °What are the symptoms of preterm labor? °Symptoms of preterm labor include: °· Cramps. The cramps may feel like the cramps some women get during their period. The cramps may happen with watery poop (diarrhea). °· Pain in the belly (abdomen). °· Pain in the lower back. °· Regular contractions or tightening. It may feel like your belly is getting tighter. °· Pressure in the lower belly that seems to get stronger. °· More fluid (discharge) leaking from the vagina. The fluid may be watery or bloody. °· Water breaking. °Why is it important to notice signs of preterm labor? °Babies who are born early may not be fully developed. They have a higher chance for: °· Long-term heart problems. °· Long-term lung problems. °· Trouble controlling body systems, like breathing. °· Bleeding in the brain. °· A condition called cerebral palsy. °· Learning difficulties. °· Death. °These risks are highest for babies who are born before 34 weeks of pregnancy. °How is preterm labor treated? °Treatment depends on: °· How long you were pregnant. °· Your condition. °· The health of your baby. °Treatment may involve: °· Having a stitch (suture) placed in your cervix. When you give birth, your cervix opens so the baby can come out. The stitch keeps the cervix  from opening too soon. °· Staying at the hospital. °· Taking or getting medicines, such as: °¨ Hormone medicines. °¨ Medicines to stop contractions. °¨ Medicines to help the baby’s lungs develop. °¨ Medicines to prevent your baby from having cerebral palsy. °What should I do if I am in preterm labor? °If you think you are going into labor too soon, call your doctor right away. °How can I prevent preterm labor? °· Do not use any tobacco products. °¨ Examples of these are cigarettes, chewing tobacco, and e-cigarettes. °¨ If you need help quitting, ask your doctor. °· Do not use street drugs. °· Do not use any medicines unless you ask your doctor if they are safe for you. °· Talk with your doctor before taking any herbal supplements. °· Make sure you gain enough weight. °· Watch for infection. If you think you might have an infection, get it checked right away. °· If you have gone into preterm labor before, tell your doctor. °This information is not intended to replace advice given to you by your health care provider. Make sure you discuss any questions you have with your health care provider. °Document Released: 07/14/2008 Document Revised: 09/28/2015 Document Reviewed: 09/08/2015 °Elsevier Interactive Patient Education © 2017 Elsevier Inc. ° °

## 2016-05-16 NOTE — Progress Notes (Signed)
   PRENATAL VISIT NOTE  Subjective:  Amber Newton is a 31 y.o. [id number] at 617w5d being seen today for ongoing prenatal care.  She is currently monitored for the following issues for this low-risk pregnancy and has GERD; HEADACHE; Supervision of normal pregnancy; Vision changes; and Preterm uterine contractions on her problem list.  Patient reports occasional contractions. States has had a lot of contractions over past few weeks. States had 7 in waiting room today.   Contractions: Irritability. Vag. Bleeding: None.  Movement: Absent. Denies leaking of fluid.   The following portions of the patient's history were reviewed and updated as appropriate: allergies, current medications, past family history, past medical history, past social history, past surgical history and problem list. Problem list updated.  Objective:   Vitals:   05/15/16 1055  BP: 107/70  Pulse: 85  Weight: 216 lb (98 kg)    Fetal Status: Fetal Heart Rate (bpm): 137   Movement: Absent     General:  Alert, oriented and cooperative. Patient is in no acute distress.  Skin: Skin is warm and dry. No rash noted.   Cardiovascular: Normal heart rate noted  Respiratory: Normal respiratory effort, no problems with respiration noted  Abdomen: Soft, gravid, appropriate for gestational age. Pain/Pressure: Present     Pelvic:  Cervical exam performed      Cervix long and closed  Extremities: Normal range of motion.  Edema: None  Mental Status: Normal mood and affect. Normal behavior. Normal judgment and thought content.   Assessment and Plan:  Pregnancy: [id number] at 427w5d  1. Preterm contractions     FFn done and was Negative     EFM showed only two contractions - Fetal fibronectin  2. Preterm uterine contractions     Precautions reviewed  Preterm labor symptoms and general obstetric precautions including but not limited to vaginal bleeding, contractions, leaking of fluid and fetal movement were reviewed in detail with the  patient. Please refer to After Visit Summary for other counseling recommendations.  RTO 2 weeks  Aviva SignsMarie L Celestine Prim, CNM

## 2016-05-25 ENCOUNTER — Encounter: Payer: Self-pay | Admitting: Neurology

## 2016-05-29 ENCOUNTER — Ambulatory Visit (INDEPENDENT_AMBULATORY_CARE_PROVIDER_SITE_OTHER): Payer: Medicaid Other | Admitting: Advanced Practice Midwife

## 2016-05-29 ENCOUNTER — Encounter: Payer: Self-pay | Admitting: Advanced Practice Midwife

## 2016-05-29 VITALS — BP 123/7 | HR 85 | Wt 223.0 lb

## 2016-05-29 DIAGNOSIS — Z3483 Encounter for supervision of other normal pregnancy, third trimester: Secondary | ICD-10-CM

## 2016-05-29 DIAGNOSIS — O4703 False labor before 37 completed weeks of gestation, third trimester: Secondary | ICD-10-CM

## 2016-05-29 DIAGNOSIS — O4700 False labor before 37 completed weeks of gestation, unspecified trimester: Secondary | ICD-10-CM

## 2016-05-29 DIAGNOSIS — O479 False labor, unspecified: Secondary | ICD-10-CM

## 2016-05-29 NOTE — Patient Instructions (Signed)
Braxton Hicks Contractions °Contractions of the uterus can occur throughout pregnancy. Contractions are not always a sign that you are in labor.  °WHAT ARE BRAXTON HICKS CONTRACTIONS?  °Contractions that occur before labor are called Braxton Hicks contractions, or false labor. Toward the end of pregnancy (32-34 weeks), these contractions can develop more often and may become more forceful. This is not true labor because these contractions do not result in opening (dilatation) and thinning of the cervix. They are sometimes difficult to tell apart from true labor because these contractions can be forceful and people have different pain tolerances. You should not feel embarrassed if you go to the hospital with false labor. Sometimes, the only way to tell if you are in true labor is for your health care provider to look for changes in the cervix. °If there are no prenatal problems or other health problems associated with the pregnancy, it is completely safe to be sent home with false labor and await the onset of true labor. °HOW CAN YOU TELL THE DIFFERENCE BETWEEN TRUE AND FALSE LABOR? °False Labor  °· The contractions of false labor are usually shorter and not as hard as those of true labor.   °· The contractions are usually irregular.   °· The contractions are often felt in the front of the lower abdomen and in the groin.   °· The contractions may go away when you walk around or change positions while lying down.   °· The contractions get weaker and are shorter lasting as time goes on.   °· The contractions do not usually become progressively stronger, regular, and closer together as with true labor.   °True Labor  °· Contractions in true labor last 30-70 seconds, become very regular, usually become more intense, and increase in frequency.   °· The contractions do not go away with walking.   °· The discomfort is usually felt in the top of the uterus and spreads to the lower abdomen and low back.   °· True labor can be  determined by your health care provider with an exam. This will show that the cervix is dilating and getting thinner.   °WHAT TO REMEMBER °· Keep up with your usual exercises and follow other instructions given by your health care provider.   °· Take medicines as directed by your health care provider.   °· Keep your regular prenatal appointments.   °· Eat and drink lightly if you think you are going into labor.   °· If Braxton Hicks contractions are making you uncomfortable:   °¨ Change your position from lying down or resting to walking, or from walking to resting.   °¨ Sit and rest in a tub of warm water.   °¨ Drink 2-3 glasses of water. Dehydration may cause these contractions.   °¨ Do slow and deep breathing several times an hour.   °WHEN SHOULD I SEEK IMMEDIATE MEDICAL CARE? °Seek immediate medical care if: °· Your contractions become stronger, more regular, and closer together.   °· You have fluid leaking or gushing from your vagina.   °· You have a fever.   °· You pass blood-tinged mucus.   °· You have vaginal bleeding.   °· You have continuous abdominal pain.   °· You have low back pain that you never had before.   °· You feel your baby's head pushing down and causing pelvic pressure.   °· Your baby is not moving as much as it used to.   °This information is not intended to replace advice given to you by your health care provider. Make sure you discuss any questions you have with your health care   provider. °Document Released: 04/17/2005 Document Revised: 08/09/2015 Document Reviewed: 01/27/2013 °Elsevier Interactive Patient Education © 2017 Elsevier Inc. ° °

## 2016-05-29 NOTE — Progress Notes (Signed)
   PRENATAL VISIT NOTE  Subjective:  Amber Newton is a 31 y.o. [phone] at 6063w4d being seen today for ongoing prenatal care.  She is currently monitored for the following issues for this low-risk pregnancy and has GERD; HEADACHE; Supervision of normal pregnancy; Vision changes; and Preterm uterine contractions on her problem list.  Patient reports occasional contractions.  Contractions: Irregular. Vag. Bleeding: None.  Movement: Present. Denies leaking of fluid.   The following portions of the patient's history were reviewed and updated as appropriate: allergies, current medications, past family history, past medical history, past social history, past surgical history and problem list. Problem list updated.  Objective:   Vitals:   05/29/16 1005  BP: (!) 123/7  Pulse: 85  Weight: 223 lb (101.2 kg)    Fetal Status: Fetal Heart Rate (bpm): 142 Fundal Height: 34 cm Movement: Present  Presentation: Vertex  General:  Alert, oriented and cooperative. Patient is in no acute distress.  Skin: Skin is warm and dry. No rash noted.   Cardiovascular: Normal heart rate noted  Respiratory: Normal respiratory effort, no problems with respiration noted  Abdomen: Soft, gravid, appropriate for gestational age. Pain/Pressure: Present     Pelvic:  Cervical exam performed Dilation: Fingertip Effacement (%): 0 Station: -3  Extremities: Normal range of motion.  Edema: Trace  Mental Status: Normal mood and affect. Normal behavior. Normal judgment and thought content.   Assessment and Plan:  Pregnancy: [id number] at 4963w4d  There are no diagnoses linked to this encounter. Preterm labor symptoms and general obstetric precautions including but not limited to vaginal bleeding, contractions, leaking of fluid and fetal movement were reviewed in detail with the patient. Please refer to After Visit Summary for other counseling recommendations.  Return in about 2 weeks (around 06/12/2016) for ROB.   Amber Newton,  CNM

## 2016-06-12 ENCOUNTER — Ambulatory Visit (INDEPENDENT_AMBULATORY_CARE_PROVIDER_SITE_OTHER): Payer: Medicaid Other | Admitting: Family

## 2016-06-12 VITALS — BP 115/74 | HR 80 | Wt 226.0 lb

## 2016-06-12 DIAGNOSIS — Z3483 Encounter for supervision of other normal pregnancy, third trimester: Secondary | ICD-10-CM

## 2016-06-12 DIAGNOSIS — Z3A36 36 weeks gestation of pregnancy: Secondary | ICD-10-CM

## 2016-06-12 NOTE — Progress Notes (Signed)
   PRENATAL VISIT NOTE  Subjective:  Amber Newton is a 31 y.o. [phone] at 6115w4d being seen today for ongoing prenatal care.  She is currently monitored for the following issues for this low-risk pregnancy and has GERD; HEADACHE; Supervision of normal pregnancy; Vision changes; and Preterm uterine contractions on her problem list.  Patient reports no complaints.  Contractions: Irregular. Vag. Bleeding: None.  Movement: Present. Denies leaking of fluid.   The following portions of the patient's history were reviewed and updated as appropriate: allergies, current medications, past family history, past medical history, past social history, past surgical history and problem list. Problem list updated.  Objective:   Vitals:   06/12/16 0953  BP: 115/74  Pulse: 80  Weight: 226 lb (102.5 kg)    Fetal Status: Fetal Heart Rate (bpm): 132 Fundal Height: 37 cm Movement: Present  Presentation: Vertex  General:  Alert, oriented and cooperative. Patient is in no acute distress.  Skin: Skin is warm and dry. No rash noted.   Cardiovascular: Normal heart rate noted  Respiratory: Normal respiratory effort, no problems with respiration noted  Abdomen: Soft, gravid, appropriate for gestational age. Pain/Pressure: Present     Pelvic:  Cervical exam performed Dilation: Fingertip      Extremities: Normal range of motion.  Edema: Trace  Mental Status: Normal mood and affect. Normal behavior. Normal judgment and thought content.   Assessment and Plan:  Pregnancy: G5P3013 at 6115w4d  1. [redacted] weeks gestation of pregnancy - Culture, beta strep (group b only) - GC/Chlamydia Probe Amp  Preterm labor symptoms and general obstetric precautions including but not limited to vaginal bleeding, contractions, leaking of fluid and fetal movement were reviewed in detail with the patient. Please refer to After Visit Summary for other counseling recommendations.  Return in about 1 week (around 06/19/2016).   Eino FarberWalidah Kennith GainN  Karim, CNM

## 2016-06-13 LAB — GC/CHLAMYDIA PROBE AMP
CT Probe RNA: NOT DETECTED
GC PROBE AMP APTIMA: NOT DETECTED

## 2016-06-14 ENCOUNTER — Telehealth: Payer: Self-pay

## 2016-06-14 LAB — CULTURE, BETA STREP (GROUP B ONLY)

## 2016-06-14 NOTE — Telephone Encounter (Signed)
Spoke with pt and she is aware of her results from from her GBS swab (Group B Strep isolated). I informed her that she would be given antibiotics during labor.

## 2016-06-19 ENCOUNTER — Ambulatory Visit (INDEPENDENT_AMBULATORY_CARE_PROVIDER_SITE_OTHER): Payer: Medicaid Other | Admitting: Advanced Practice Midwife

## 2016-06-19 ENCOUNTER — Encounter: Payer: Self-pay | Admitting: Advanced Practice Midwife

## 2016-06-19 VITALS — BP 123/70 | HR 87 | Wt 228.0 lb

## 2016-06-19 DIAGNOSIS — Z3483 Encounter for supervision of other normal pregnancy, third trimester: Secondary | ICD-10-CM

## 2016-06-19 NOTE — Patient Instructions (Signed)

## 2016-06-19 NOTE — Progress Notes (Signed)
   PRENATAL VISIT NOTE  Subjective:  Amber Newton is a 31 y.o. [phone] at 773w4d being seen today for ongoing prenatal care.  She is currently monitored for the following issues for this low-risk pregnancy and has GERD; HEADACHE; Supervision of normal pregnancy; Vision changes; and Preterm uterine contractions on her problem list.  Patient reports backache and occasional contractions.  Contractions: Irregular. Vag. Bleeding: None.  Movement: Present. Denies leaking of fluid.   The following portions of the patient's history were reviewed and updated as appropriate: allergies, current medications, past family history, past medical history, past social history, past surgical history and problem list. Problem list updated.  Objective:   Vitals:   06/19/16 1004  BP: 123/70  Pulse: 87  Weight: 228 lb (103.4 kg)    Fetal Status: Fetal Heart Rate (bpm): 155   Movement: Present     General:  Alert, oriented and cooperative. Patient is in no acute distress.  Skin: Skin is warm and dry. No rash noted.   Cardiovascular: Normal heart rate noted  Respiratory: Normal respiratory effort, no problems with respiration noted  Abdomen: Soft, gravid, appropriate for gestational age. Pain/Pressure: Present     Pelvic:  Cervical exam deferred        Extremities: Normal range of motion.  Edema: Trace  Mental Status: Normal mood and affect. Normal behavior. Normal judgment and thought content.   Assessment and Plan:  Pregnancy: A5W0981G5P3013 at 4873w4d  There are no diagnoses linked to this encounter. Term labor symptoms and general obstetric precautions including but not limited to vaginal bleeding, contractions, leaking of fluid and fetal movement were reviewed in detail with the patient. Please refer to After Visit Summary for other counseling recommendations.  RTO 1 week  [name], CNM

## 2016-06-26 ENCOUNTER — Ambulatory Visit (INDEPENDENT_AMBULATORY_CARE_PROVIDER_SITE_OTHER): Payer: Medicaid Other | Admitting: Certified Nurse Midwife

## 2016-06-26 VITALS — BP 119/74 | HR 73 | Wt 229.0 lb

## 2016-06-26 DIAGNOSIS — O26893 Other specified pregnancy related conditions, third trimester: Secondary | ICD-10-CM

## 2016-06-26 DIAGNOSIS — Z3483 Encounter for supervision of other normal pregnancy, third trimester: Secondary | ICD-10-CM

## 2016-06-26 DIAGNOSIS — K219 Gastro-esophageal reflux disease without esophagitis: Secondary | ICD-10-CM

## 2016-06-26 DIAGNOSIS — R102 Pelvic and perineal pain: Secondary | ICD-10-CM

## 2016-06-26 DIAGNOSIS — O99820 Streptococcus B carrier state complicating pregnancy: Secondary | ICD-10-CM

## 2016-06-26 MED ORDER — COMFORT FIT MATERNITY SUPP LG MISC: 1.0000 "application " | Freq: Every day | 0 refills | Status: DC

## 2016-06-26 MED ORDER — RANITIDINE HCL 150 MG PO TABS: 150.0000 mg | ORAL_TABLET | Freq: Every day | ORAL | 0 refills | Status: AC

## 2016-06-26 NOTE — Progress Notes (Signed)
Subjective:  Amber Newton is a 31 y.o. [phone] at 5769w4d being seen today for ongoing prenatal care.  She is currently monitored for the following issues for this low-risk pregnancy and has GERD; HEADACHE; Supervision of normal pregnancy; Vision changes; and Preterm uterine contractions on her problem list.  Patient reports pelvic discomfort and pressure.  Contractions: Irregular. Vag. Bleeding: None.  Movement: Present. Denies leaking of fluid.   The following portions of the patient's history were reviewed and updated as appropriate: allergies, current medications, past family history, past medical history, past social history, past surgical history and problem list. Problem list updated.  Objective:   Vitals:   06/26/16 1033  BP: 119/74  Pulse: 73  Weight: 229 lb (103.9 kg)    Fetal Status: Fetal Heart Rate (bpm): 144 Fundal Height: 39 cm Movement: Present  Presentation: Vertex  General:  Alert, oriented and cooperative. Patient is in no acute distress.  Skin: Skin is warm and dry. No rash noted.   Cardiovascular: Normal heart rate noted  Respiratory: Normal respiratory effort, no problems with respiration noted  Abdomen: Soft, gravid, appropriate for gestational age. Pain/Pressure: Present     Pelvic: Vag. Bleeding: None Vag D/C Character: Thin   Cervical exam performed Dilation: 1 Effacement (%): 50 Station: -3  Extremities: Normal range of motion.  Edema: Trace  Mental Status: Normal mood and affect. Normal behavior. Normal judgment and thought content.   Urinalysis: Urine Protein: Negative Urine Glucose: Negative  Assessment and Plan:  Pregnancy: [id number] at 4269w4d  1. Supervision of other normal pregnancy - frustrated d/t pelvic discomfort and not delivered yet -may start EPO 1 po or pv daily  2. GERD - Rx Zantac 150 mg po daily  3. Pelvic pain in pregnancy -physiologic to term gestation and fetal engagement -Rx maternity support belt -warm baths  4. GBS carrier -PCN  intrapartum  Term labor symptoms and general obstetric precautions including but not limited to vaginal bleeding, contractions, leaking of fluid and fetal movement were reviewed in detail with the patient. Please refer to After Visit Summary for other counseling recommendations.  Return in about 1 week (around 07/03/2016).   Donette LarryMelanie Zyion Leidner, CNM

## 2016-06-30 ENCOUNTER — Inpatient Hospital Stay (HOSPITAL_COMMUNITY): Payer: Medicaid Other | Admitting: Anesthesiology

## 2016-06-30 ENCOUNTER — Encounter (HOSPITAL_COMMUNITY): Payer: Self-pay

## 2016-06-30 ENCOUNTER — Encounter (HOSPITAL_COMMUNITY)
Admission: AD | Disposition: A | Discharge status: Home / Self Care | Payer: Self-pay | Source: Ambulatory Visit | Attending: Obstetrics and Gynecology

## 2016-06-30 ENCOUNTER — Inpatient Hospital Stay (HOSPITAL_COMMUNITY)
Admission: AD | Admit: 2016-06-30 | Discharge: 2016-07-02 | DRG: 769 | Disposition: A | Discharge status: Home / Self Care | Payer: Medicaid Other | Source: Ambulatory Visit | Attending: Obstetrics and Gynecology | Admitting: Obstetrics and Gynecology

## 2016-06-30 DIAGNOSIS — Z3A39 39 weeks gestation of pregnancy: Secondary | ICD-10-CM

## 2016-06-30 DIAGNOSIS — Z302 Encounter for sterilization: Secondary | ICD-10-CM | POA: Diagnosis not present

## 2016-06-30 DIAGNOSIS — O99824 Streptococcus B carrier state complicating childbirth: Secondary | ICD-10-CM

## 2016-06-30 DIAGNOSIS — Z3483 Encounter for supervision of other normal pregnancy, third trimester: Secondary | ICD-10-CM

## 2016-06-30 HISTORY — PX: TUBAL LIGATION: SHX77

## 2016-06-30 LAB — CBC
HEMATOCRIT: 38.6 % (ref 36.0–46.0)
HEMOGLOBIN: 13 g/dL (ref 12.0–15.0)
MCH: 26.7 pg (ref 26.0–34.0)
MCHC: 33.7 g/dL (ref 30.0–36.0)
MCV: 79.4 fL (ref 78.0–100.0)
Platelets: 222 10*3/uL (ref 150–400)
RBC: 4.86 MIL/uL (ref 3.87–5.11)
RDW: 15 % (ref 11.5–15.5)
WBC: 8 10*3/uL (ref 4.0–10.5)

## 2016-06-30 LAB — ABO/RH: ABO/RH(D): A POS

## 2016-06-30 SURGERY — LIGATION, FALLOPIAN TUBE, POSTPARTUM
Anesthesia: Spinal | Site: Abdomen | Laterality: Bilateral

## 2016-06-30 MED ORDER — SODIUM CHLORIDE 0.9% FLUSH: 3.0000 mL | Freq: Two times a day (BID) | INTRAVENOUS | Status: DC

## 2016-06-30 MED ORDER — KETOROLAC TROMETHAMINE 30 MG/ML IJ SOLN
INTRAMUSCULAR | Status: AC
  Filled 2016-06-30: qty 1

## 2016-06-30 MED ORDER — FAMOTIDINE 20 MG PO TABS
40.0000 mg | ORAL_TABLET | Freq: Once | ORAL | Status: AC
  Administered 2016-06-30: 40 mg via ORAL
  Filled 2016-06-30 (×2): qty 2

## 2016-06-30 MED ORDER — ONDANSETRON HCL 4 MG/2ML IJ SOLN
INTRAMUSCULAR | Status: DC | PRN
  Administered 2016-06-30: 4 mg via INTRAVENOUS

## 2016-06-30 MED ORDER — DIBUCAINE 1 % RE OINT: 1.0000 "application " | TOPICAL_OINTMENT | RECTAL | Status: DC | PRN

## 2016-06-30 MED ORDER — SODIUM CHLORIDE 0.9% FLUSH: 3.0000 mL | INTRAVENOUS | Status: DC | PRN

## 2016-06-30 MED ORDER — SENNOSIDES-DOCUSATE SODIUM 8.6-50 MG PO TABS
2.0000 | ORAL_TABLET | ORAL | Status: DC
  Administered 2016-07-01 (×2): 2 via ORAL
  Filled 2016-06-30 (×2): qty 2

## 2016-06-30 MED ORDER — ZOLPIDEM TARTRATE 5 MG PO TABS: 5.0000 mg | ORAL_TABLET | Freq: Every evening | ORAL | Status: DC | PRN

## 2016-06-30 MED ORDER — DIPHENHYDRAMINE HCL 25 MG PO CAPS: 25.0000 mg | ORAL_CAPSULE | Freq: Four times a day (QID) | ORAL | Status: DC | PRN

## 2016-06-30 MED ORDER — PRENATAL MULTIVITAMIN CH
1.0000 | ORAL_TABLET | Freq: Every day | ORAL | Status: DC
  Administered 2016-06-30 – 2016-07-02 (×3): 1 via ORAL
  Filled 2016-06-30 (×4): qty 1

## 2016-06-30 MED ORDER — LACTATED RINGERS IV SOLN: INTRAVENOUS | Status: DC

## 2016-06-30 MED ORDER — BUPIVACAINE IN DEXTROSE 0.75-8.25 % IT SOLN
INTRATHECAL | Status: DC | PRN
  Administered 2016-06-30: 10.5 mg via INTRATHECAL

## 2016-06-30 MED ORDER — MIDAZOLAM HCL 2 MG/2ML IJ SOLN
INTRAMUSCULAR | Status: AC
  Filled 2016-06-30: qty 2

## 2016-06-30 MED ORDER — BENZOCAINE-MENTHOL 20-0.5 % EX AERO: 1.0000 "application " | INHALATION_SPRAY | CUTANEOUS | Status: DC | PRN

## 2016-06-30 MED ORDER — DEXAMETHASONE SODIUM PHOSPHATE 4 MG/ML IJ SOLN
INTRAMUSCULAR | Status: AC
  Filled 2016-06-30: qty 1

## 2016-06-30 MED ORDER — LACTATED RINGERS IV SOLN
INTRAVENOUS | Status: DC | PRN
  Administered 2016-06-30 (×2): via INTRAVENOUS

## 2016-06-30 MED ORDER — COCONUT OIL OIL
1.0000 "application " | TOPICAL_OIL | Status: DC | PRN
  Administered 2016-07-02: 1 via TOPICAL
  Filled 2016-06-30: qty 120

## 2016-06-30 MED ORDER — FENTANYL CITRATE (PF) 100 MCG/2ML IJ SOLN: 25.0000 ug | INTRAMUSCULAR | Status: DC | PRN

## 2016-06-30 MED ORDER — TETANUS-DIPHTH-ACELL PERTUSSIS 5-2.5-18.5 LF-MCG/0.5 IM SUSP: 0.5000 mL | Freq: Once | INTRAMUSCULAR | Status: DC

## 2016-06-30 MED ORDER — DEXAMETHASONE SODIUM PHOSPHATE 10 MG/ML IJ SOLN
INTRAMUSCULAR | Status: DC | PRN
  Administered 2016-06-30: 4 mg via INTRAVENOUS

## 2016-06-30 MED ORDER — ONDANSETRON HCL 4 MG/2ML IJ SOLN
INTRAMUSCULAR | Status: AC
  Filled 2016-06-30: qty 2

## 2016-06-30 MED ORDER — OXYTOCIN 40 UNITS IN LACTATED RINGERS INFUSION - SIMPLE MED: 2.5000 [IU]/h | INTRAVENOUS | Status: DC | PRN

## 2016-06-30 MED ORDER — ONDANSETRON HCL 4 MG/2ML IJ SOLN: 4.0000 mg | INTRAMUSCULAR | Status: DC | PRN

## 2016-06-30 MED ORDER — SODIUM CHLORIDE 0.9 % IV SOLN: 250.0000 mL | INTRAVENOUS | Status: DC | PRN

## 2016-06-30 MED ORDER — ACETAMINOPHEN 325 MG PO TABS
650.0000 mg | ORAL_TABLET | ORAL | Status: DC | PRN
  Administered 2016-06-30 – 2016-07-01 (×4): 650 mg via ORAL
  Filled 2016-06-30 (×4): qty 2

## 2016-06-30 MED ORDER — MIDAZOLAM HCL 5 MG/5ML IJ SOLN
INTRAMUSCULAR | Status: DC | PRN
  Administered 2016-06-30: 1 mg via INTRAVENOUS

## 2016-06-30 MED ORDER — BUPIVACAINE HCL 0.5 % IJ SOLN
INTRAMUSCULAR | Status: DC | PRN
  Administered 2016-06-30: 5 mL
  Administered 2016-06-30: 10 mL

## 2016-06-30 MED ORDER — SIMETHICONE 80 MG PO CHEW: 80.0000 mg | CHEWABLE_TABLET | ORAL | Status: DC | PRN

## 2016-06-30 MED ORDER — OXYCODONE HCL 5 MG PO TABS
5.0000 mg | ORAL_TABLET | ORAL | Status: DC | PRN
  Administered 2016-06-30 – 2016-07-01 (×4): 5 mg via ORAL
  Filled 2016-06-30 (×4): qty 1

## 2016-06-30 MED ORDER — ONDANSETRON HCL 4 MG PO TABS: 4.0000 mg | ORAL_TABLET | ORAL | Status: DC | PRN

## 2016-06-30 MED ORDER — IBUPROFEN 600 MG PO TABS
600.0000 mg | ORAL_TABLET | Freq: Four times a day (QID) | ORAL | Status: DC
  Administered 2016-06-30 – 2016-07-02 (×9): 600 mg via ORAL
  Filled 2016-06-30 (×9): qty 1

## 2016-06-30 MED ORDER — METOCLOPRAMIDE HCL 5 MG/ML IJ SOLN: 10.0000 mg | Freq: Once | INTRAMUSCULAR | Status: DC | PRN

## 2016-06-30 MED ORDER — WITCH HAZEL-GLYCERIN EX PADS: 1.0000 "application " | MEDICATED_PAD | CUTANEOUS | Status: DC | PRN

## 2016-06-30 MED ORDER — MEPERIDINE HCL 25 MG/ML IJ SOLN
6.2500 mg | INTRAMUSCULAR | Status: DC | PRN
Start: 2016-06-30 — End: 2016-06-30

## 2016-06-30 MED ORDER — METOCLOPRAMIDE HCL 10 MG PO TABS
10.0000 mg | ORAL_TABLET | Freq: Once | ORAL | Status: AC
  Administered 2016-06-30: 10 mg via ORAL
  Filled 2016-06-30: qty 1

## 2016-06-30 SURGICAL SUPPLY — 27 items
CLOTH BEACON ORANGE TIMEOUT ST (SAFETY) ×3 IMPLANT
CONTAINER PREFILL 10% NBF 15ML (MISCELLANEOUS) ×3 IMPLANT
DERMABOND ADVANCED (GAUZE/BANDAGES/DRESSINGS) ×2
DERMABOND ADVANCED .7 DNX12 (GAUZE/BANDAGES/DRESSINGS) ×1 IMPLANT
DRSG OPSITE POSTOP 3X4 (GAUZE/BANDAGES/DRESSINGS) ×3 IMPLANT
DURAPREP 26ML APPLICATOR (WOUND CARE) ×3 IMPLANT
ELECT REM PT RETURN 9FT ADLT (ELECTROSURGICAL) ×3
ELECTRODE REM PT RTRN 9FT ADLT (ELECTROSURGICAL) ×1 IMPLANT
GLOVE BIO SURGEON STRL SZ7 (GLOVE) ×3 IMPLANT
GLOVE BIOGEL PI IND STRL 7.5 (GLOVE) ×1 IMPLANT
GLOVE BIOGEL PI INDICATOR 7.5 (GLOVE) ×2
GOWN STRL REUS W/TWL LRG LVL3 (GOWN DISPOSABLE) ×6 IMPLANT
NEEDLE HYPO 22GX1.5 SAFETY (NEEDLE) ×3 IMPLANT
NS IRRIG 1000ML POUR BTL (IV SOLUTION) ×3 IMPLANT
PACK ABDOMINAL MINOR (CUSTOM PROCEDURE TRAY) ×3 IMPLANT
PENCIL BUTTON HOLSTER BLD 10FT (ELECTRODE) IMPLANT
PROTECTOR NERVE ULNAR (MISCELLANEOUS) ×3 IMPLANT
SPONGE LAP 4X18 X RAY DECT (DISPOSABLE) ×3 IMPLANT
SUT MNCRL AB 4-0 PS2 18 (SUTURE) ×3 IMPLANT
SUT PLAIN 2 0 (SUTURE)
SUT PLAIN 3 0 FS 2 27 (SUTURE) ×3 IMPLANT
SUT PLAIN ABS 2-0 CT1 27XMFL (SUTURE) IMPLANT
SUT VICRYL 0 TIES 12 18 (SUTURE) IMPLANT
SUT VICRYL 0 UR6 27IN ABS (SUTURE) ×3 IMPLANT
SYR CONTROL 10ML LL (SYRINGE) ×3 IMPLANT
TOWEL OR 17X24 6PK STRL BLUE (TOWEL DISPOSABLE) ×6 IMPLANT
TRAY FOLEY CATH SILVER 14FR (SET/KITS/TRAYS/PACK) ×3 IMPLANT

## 2016-06-30 NOTE — H&P (Signed)
LABOR AND DELIVERY ADMISSION HISTORY AND PHYSICAL NOTE  Amber Newton is a 31 y.o. female [phone] with IUP at 4132w1d by 16 wk US presenting for delivery of baby in the car.   Patient states she started to have contractions around 3:30am, was waiting to see if this was labor. Around 7:30 am, felt the contractions were frequent and painful enough, worked way in with husband driving the car. On her way in, she felt the urge to push, pushed once with a contraction, felt baby's head, pushed a second time, delivered the baby in the car with SROM occurring at delivery. Baby was vigorously crying, she dried the baby off, and kept baby skin to skin with umbilical cord attached.   Arrived to MAU, cord was clampedx2 and cut and placenta delivered by provider. No vaginal tears. Normal lochia after. Patient already successfully breastfeeding.   Prenatal History/Complications:  Past Medical History: Past Medical History:  Diagnosis Date  . Abnormal Pap smear    HPV, colpo  . Anemia   . Headache(784.0)   . Infection    UTI  . Vaginal Pap smear, abnormal     Past Surgical History: Past Surgical History:  Procedure Laterality Date  . NO PAST SURGERIES    . WISDOM TOOTH EXTRACTION      Obstetrical History: OB History    Gravida Para Term Preterm AB Living   5 3 3  0 1 3   SAB TAB Ectopic Multiple Live Births   1 0 0 0 3      Social History: Social History   Social History  . Marital status: Married    Spouse name: N/A  . Number of children: N/A  . Years of education: N/A   Occupational History  . student    Social History Main Topics  . Smoking status: Never Smoker  . Smokeless tobacco: Never Used  . Alcohol use No  . Drug use: No  . Sexual activity: Yes    Partners: Male    Birth control/ protection: None   Other Topics Concern  . None   Social History Narrative  . None    Family History: Family History  Problem Relation Age of Onset  . Hypertension Mother   . Cancer  Father   . Cancer Maternal Aunt   . Hypertension Maternal Grandmother   . Diabetes Maternal Grandmother   . Diabetes Paternal Grandmother   . Cancer Paternal Grandfather     Allergies: No Known Allergies  Prescriptions Prior to Admission  Medication Sig Dispense Refill Last Dose  . Elastic Bandages & Supports (COMFORT FIT MATERNITY SUPP LG) MISC 1 application by Does not apply route daily. 1 each 0   . Prenat w/o A Vit-FeFum-FePo-FA (CONCEPT OB) 130-92.4-1 MG CAPS Take 1 tablet by mouth daily. (Patient not taking: Reported on 04/27/2016) 30 capsule 12 Not Taking  . Prenatal Vit-Fe Fumarate-FA (PRENATAL VITAMIN) 27-0.8 MG TABS Take by mouth.   Not Taking  . ranitidine (ZANTAC) 150 MG tablet Take 1 tablet (150 mg total) by mouth at bedtime. 30 tablet 0   . sertraline (ZOLOFT) 50 MG tablet Take 1 tablet (50 mg total) by mouth daily. (Patient not taking: Reported on 04/27/2016) 30 tablet 3 Not Taking     Review of Systems   All systems reviewed and negative except as stated in HPI  Blood pressure 138/79, pulse 67, temperature 97.8 F (36.6 C), temperature source Oral, resp. rate 16, last menstrual period 09/11/2015, currently breastfeeding. General appearance: alert,  cooperative, appears stated age and no distress Lungs: clear to auscultation bilaterally Heart: regular rate and rhythm Abdomen: soft, non-tender; bowel sounds normal; fundus firm at umbilicus. Extremities: No calf swelling or tenderness  Pelvic: Fundus firm, small amount of bleeding without clots. Vaginal exam showed no lacerations needing repair, small abrasion on right periurethral area that is hemostased.   Prenatal labs: ABO, Rh:   A Pos Antibody:   Neg Rubella: !Error! IMMUNE RPR: NON REAC (12/01 0921)  HBsAg:   NEG HIV: NONREACTIVE (12/01 0921)  GBS:   POSITIVE 2 hr Glucola: Incorrectly performed but negative FBS, negative 2 hour (1 hour not performed) Genetic screening:  NEG Anatomy US: NORMAL,  female  Prenatal Transfer Tool  Maternal Diabetes: No Genetic Screening: Normal Maternal Ultrasounds/Referrals: Normal Fetal Ultrasounds or other Referrals:  None Maternal Substance Abuse:  No Significant Maternal Medications:  None Significant Maternal Lab Results: Lab values include: Group B Strep positive  No results found for this or any previous visit (from the past 24 hour(s)).  Patient Active Problem List   Diagnosis Date Noted  . Preterm uterine contractions 05/16/2016  . Supervision of normal pregnancy 02/02/2016  . Vision changes 02/02/2016  . GBS (group B Streptococcus carrier), +RV culture, currently pregnant 06/08/2014  . GERD 05/31/2009  . HEADACHE 05/31/2009    Assessment: Amber Newton is a 31 y.o. G5P3013 at [redacted]w[redacted]d here for precipitous delivery in the car.   #Labor: Delivered in the car #Pain:  IBuprofen, Oxycodone for breakthrough pain prn #FWB:  Baby is vigorous, alert, breastfeeding #ID:   GBS Pos - inadequately treated due to precipitous delivery #MOF:  Breastfeeding #MOC: Tubal ligation, planned for 1:15pm today #Circ:   N/A - girl  Routine postpartum care. Plan for BTL at 1:15pm today. NPO until after procedure.  Jen Mow, DO OB Fellow Center for [facility], [facility] 06/30/2016, 9:48 AM

## 2016-06-30 NOTE — Anesthesia Preprocedure Evaluation (Signed)
Anesthesia Evaluation  Patient identified by MRN, date of birth, ID band Patient awake    Reviewed: Allergy & Precautions, NPO status , Patient's Chart, lab work & pertinent test results  Airway Mallampati: II  TM Distance: >3 FB Neck ROM: Full    Dental no notable dental hx.    Pulmonary neg pulmonary ROS,    Pulmonary exam normal breath sounds clear to auscultation       Cardiovascular negative cardio ROS Normal cardiovascular exam Rhythm:Regular Rate:Normal     Neuro/Psych negative neurological ROS  negative psych ROS   GI/Hepatic negative GI ROS, Neg liver ROS,   Endo/Other  negative endocrine ROS  Renal/GU negative Renal ROS  negative genitourinary   Musculoskeletal negative musculoskeletal ROS (+)   Abdominal   Peds negative pediatric ROS (+)  Hematology negative hematology ROS (+)   Anesthesia Other Findings   Reproductive/Obstetrics negative OB ROS                             Anesthesia Physical Anesthesia Plan  ASA: II  Anesthesia Plan: Spinal   Post-op Pain Management:    Induction:   Airway Management Planned:   Additional Equipment:   Intra-op Plan:   Post-operative Plan:   Informed Consent: I have reviewed the patients History and Physical, chart, labs and discussed the procedure including the risks, benefits and alternatives for the proposed anesthesia with the patient or authorized representative who has indicated his/her understanding and acceptance.   Dental advisory given  Plan Discussed with: CRNA  Anesthesia Plan Comments:         Anesthesia Quick Evaluation

## 2016-06-30 NOTE — Progress Notes (Signed)
OB Note Patient seen and examined. R/b/a d/w pt including permanency and standard operative risks. Pt denies any abdominal surgeries. BTL papers UTD. Patient still desires BTL. FF at the umbilicus. Consent signed and okay to proceed when OR is ready  Amber Newton, Jr MD Attending [facility] ([facility]ulty [facility]) 06/30/2016 Time: [id number]

## 2016-06-30 NOTE — Anesthesia Postprocedure Evaluation (Signed)
Anesthesia Post Note  Patient: Amber Newton  Procedure(s) Performed: Procedure(s) (LRB): POST PARTUM TUBAL LIGATION (Bilateral)  Patient location during evaluation: PACU Anesthesia Type: Spinal Level of consciousness: awake and alert Pain management: pain level controlled Vital Signs Assessment: post-procedure vital signs reviewed and stable Respiratory status: spontaneous breathing and respiratory function stable Cardiovascular status: blood pressure returned to baseline and stable Postop Assessment: no headache, no backache and spinal receding Anesthetic complications: no        Last Vitals:  Vitals:   06/30/16 1445 06/30/16 1500  BP: 120/68 124/69  Pulse: 64 (!) 58  Resp: (!) 21 (!) 21  Temp:      Last Pain:  Vitals:   06/30/16 1200  TempSrc: Oral  PainSc:    Pain Goal:                 Phillips Groutarignan, Darnisha Vernet

## 2016-06-30 NOTE — MAU Note (Addendum)
Ctx's started at 0300; intense at 0600.  Started feeling pressure on the way here.  Took off pants and the baby was right there, pt delivered baby 0800.  Placed on chest, blanket over. Pt states baby was pink and crying immediately. Baby voided after placed on chest.

## 2016-06-30 NOTE — Op Note (Signed)
Operative Note   06/30/2016  PRE-OP DIAGNOSIS: Desire for permanent sterilization. Postpartum Day #0   POST-OP DIAGNOSIS: Same  SURGEON: Surgeon(s) and Role:    * McDade Bingharlie Latoi Giraldo, MD - Primary  ASSISTANT: None  ANESTHESIA: General and local  PROCEDURE: mini-laparotomy, bilateral tubal ligation via Modified Pomeroy method  ESTIMATED BLOOD LOSS: 5mL  DRAINS: indwelling foley (UOP per anesthesia note)  TOTAL IV FLUIDS: per anesthesia note  SPECIMENS:  Portions of bilateral tubes to pathology  VTE PROPHYLAXIS: SCDs to the bilateral lower extremities  ANTIBIOTICS: not indicated  COMPLICATIONS: none  DISPOSITION: PACU - hemodynamically stable.  CONDITION: stable  FINDINGS: No intra-abdominal adhesions noted. Smooth, normally contoured uterine fundus and bilateral tubes. Normal appearing tubes and normal ovaries on palpation. +lumens noted on each cross section  PROCEDURE IN DETAIL: The patient was taken to the OR where anesthesia was administed. The patient was positioned in dorsal supine. The patient was prepped and draped in the normal sterile fashion a 4cm horizontal incision was made approximately 2cm above the umbilicus, after injection with local anesthesia. The skin was then incised with the scalpel and the underlying tissue dissected with the bovie and the fascia nicked in the midline with the scalpel and then extended laterally sharply.  The abdomen was then entered bluntly and a moist lap sponge used to displace the bowel.   The left Fallopian tube was palpated and then traced to it's fimbriae and an avascular midsection of the tube approximately 3-4cm from the cornua was grasped with the Babcock clamps and brought into a knuckle. The tube was double ligated with one 3-0 plain gut suture and the intervening portion of tube was transected and removed; prior to removal, another free tie of 3-0 plain gut was tied below the first suture.  Attention was then turned to the right  fallopian tube, after confirmation of identification by tracing the tube out to the fimbriae. The same procedure was then performed on the right Fallopian tube.   The lap sponge was then removed from the abdomen and the fascia closed in running fashion with 0 vicryl and the subcutaneous tissue irrigated and closed with interrupted sutures of 3-0 plain gut. The skin was then closed with 4-0 monocryl and dermabond.    The patient tolerated the procedure well. All counts were correct x 2. The patient was transferred to the recovery room awake, alert and breathing independently.   Cornelia Copaharlie Josua Ferrebee, Jr MD Attending Center for [facility](Faculty Practice)

## 2016-06-30 NOTE — Transfer of Care (Signed)
Immediate Anesthesia Transfer of Care Note  Patient: Amber Newton  Procedure(s) Performed: Procedure(s): POST PARTUM TUBAL LIGATION (Bilateral)  Patient Location: PACU  Anesthesia Type:Spinal  Level of Consciousness: awake, alert , oriented and patient cooperative  Airway & Oxygen Therapy: Spont resp  Post-op Assessment: Report given to RN and Post -op Vital signs reviewed and stable  Post vital signs: Reviewed and stable  Last Vitals:  Vitals:   06/30/16 1100 06/30/16 1200  BP: (!) 143/68 137/75  Pulse: 64 74  Resp: 16 16  Temp: 36.8 C 36.8 C    Last Pain:  Vitals:   06/30/16 1200  TempSrc: Oral  PainSc:          Complications: No apparent anesthesia complications

## 2016-06-30 NOTE — Anesthesia Procedure Notes (Signed)
Spinal  Patient location during procedure: OR Staffing Anesthesiologist: Jennylee Uehara Performed: anesthesiologist  Preanesthetic Checklist Completed: patient identified, site marked, surgical consent, pre-op evaluation, timeout performed, IV checked, risks and benefits discussed and monitors and equipment checked Spinal Block Patient position: sitting Prep: ChloraPrep Patient monitoring: heart rate, continuous pulse ox and blood pressure Approach: midline Location: L4-5 Injection technique: single-shot Needle Needle type: Sprotte  Needle gauge: 24 G Needle length: 9 cm Additional Notes Expiration date of kit checked and confirmed. Patient tolerated procedure well, without complications.       

## 2016-07-01 LAB — RPR: RPR: NONREACTIVE

## 2016-07-01 NOTE — Lactation Note (Signed)
This note was copied from a baby's chart. Lactation Consultation Note  Patient Name: Girl Hurshel PartyKelly Vanwieren Today's Date: 07/01/2016 Reason for consult: Initial assessment  Baby is 5433 hours old and has been to the breast consistently.  LC noted the baby is stuffy intermittently, not interfering with latching.  @ consult baby latched on the left breast / football/ with assist for depth/ multiple swallows noted/ increased with breast compressions. Baby fed 10 mins, release on her own, and nipple well rounded.  Both breast the breast feel like the milk is coming in. Per mom just stopped breastfeeding 4 months ago. Easily hand express several large drops, mom applied them to her nipples. Per mom the right nipple is feeling alittle sore.  No breakdown noted, areola semi compressible with some edema at the nipple areola complex. LC reviewed steps for latching - breast massage, hand express, pre - pump if needed to make the nipple areola more elastic and then reverse pressure so the baby can latch deeply,  And prevent soreness. LC instructed mom on the use shells, and recommended she wear them between feedings except when sleeping. Also mom had [facility] pump [facility].  Mother informed of post-discharge support and given phone number to the lactation department, including services for phone call assistance; out-patient appointments; and breastfeeding support group. List of other breastfeeding resources in the community given in the handout. Encouraged mother to call for problems or concerns related to breastfeeding.   Maternal Data Has patient been taught Hand Expression?: Yes Does the patient have breastfeeding experience prior to this delivery?: Yes  Feeding Feeding Type: Breast Fed Length of feed: 10 min (depth achieved and multiple swallows )  LATCH Score/Interventions Latch: Grasps breast easily, tongue down, lips flanged, rhythmical sucking. (Football / left breast ) Intervention(s):  Adjust position;Assist with latch;Breast massage;Breast compression  Audible Swallowing: Spontaneous and intermittent  Type of Nipple: Everted at rest and after stimulation (some edema at the base of the nipple )  Comfort (Breast/Nipple): Soft / non-tender     Hold (Positioning): Assistance needed to correctly position infant at breast and maintain latch. Intervention(s): Breastfeeding basics reviewed  LATCH Score: 9  Lactation Tools Discussed/Used Tools: Shells;Other (comment);Pump (colostrum conrtainers for hand expressing ) Shell Type: Inverted Breast pump type: Manual WIC Program: Yes   Consult Status Consult Status: Follow-up Date: 07/02/16 Follow-up type: In-patient    Matilde SprangMargaret Ann Zyion Doxtater 07/01/2016, 5:04 PM

## 2016-07-01 NOTE — Progress Notes (Signed)
POSTPARTUM PROGRESS NOTE  Post Partum Day 1 Subjective:  Hurshel PartyKelly Condon is a 31 y.o. [id number] 712w1d s/p SVD.  No acute events overnight.  Pt denies problems with ambulating, voiding or po intake.  She denies nausea or vomiting.  Pain is well controlled.  She has had flatus. She has not had bowel movement.  Lochia Small.   Objective: Blood pressure 125/62, pulse 66, temperature 97.6 F (36.4 C), temperature source Oral, resp. rate 18, last menstrual period 09/11/2015, SpO2 100 %, unknown if currently breastfeeding.  Physical Exam:  General: alert, cooperative and no distress Lochia:normal flow Chest: no respiratory distress Heart:regular rate, distal pulses intact Abdomen: soft, nontender,  Uterine Fundus: firm, appropriately tender DVT Evaluation: No calf swelling or tenderness Extremities: no edema   Recent Labs  06/30/16 0958  HGB 13.0  HCT 38.6    Assessment/Plan:  ASSESSMENT: Hurshel PartyKelly Rahilly is a 31 y.o. [id number] 872w1d s/p SVD  Plan for discharge tomorrow   LOS: 1 day   Nehemiah Settlena Carvalho do AmaralMD 07/01/2016, 7:46 AM

## 2016-07-02 MED ORDER — IBUPROFEN 600 MG PO TABS: 600.0000 mg | ORAL_TABLET | Freq: Four times a day (QID) | ORAL | 0 refills | Status: AC

## 2016-07-02 MED ORDER — OXYCODONE HCL 5 MG PO TABS: 5.0000 mg | ORAL_TABLET | ORAL | 0 refills | Status: AC | PRN

## 2016-07-02 NOTE — Discharge Summary (Signed)
OB Discharge Summary  Patient Name: Amber Newton DOB: 11/14/1985 MRN: [id number]  Date of admission: 06/30/2016 Delivering MD: Aviva SignsWILLIAMS, Amber Carrick L   Date of discharge: 07/02/2016  Admitting diagnosis: DELIVERED IN CAR desires sterilization  Intrauterine pregnancy: 846w1d     Secondary diagnosis:Active Problems:   Normal spontaneous vaginal delivery  Additional problems:none     Discharge diagnosis: Term Pregnancy Delivered                                                                     Post partum procedures:n/a  Augmentation: n/a  Complications: None  Hospital course:  Onset of Labor With Vaginal Delivery     31 y.o. yo [id number] at [redacted]w[redacted]d was admitted in Active Labor on 06/30/2016. Patient had an uncomplicated labor course as follows:  Membrane Rupture Time/Date: 8:00 AM ,06/30/2016   Intrapartum Procedures: Episiotomy: None [1]                                         Lacerations:     Patient had a delivery of a Viable infant. 06/30/2016  Information for the patient's newborn:  Amber Newton, Girl Amber Newton [[id number]][[id number]]  Delivery Method: Vaginal, Spontaneous Delivery (Filed from Delivery Summary)    Pateint had an uncomplicated postpartum course.  She is ambulating, tolerating a regular diet, passing flatus, and urinating well. Patient is discharged home in stable condition on 07/02/16.   Physical exam  Vitals:   07/01/16 1000 07/01/16 1427 07/01/16 1841 07/02/16 0605  BP: (!) 113/57 123/62 137/67 110/64  Pulse: 80 75 76 82  Resp: 16 16 14 18   Temp: 98.4 F (36.9 C)  97.8 F (36.6 C) 98.4 F (36.9 C)  TempSrc: Oral  Oral Oral  SpO2:   100%    General: alert, cooperative and no distress Lochia: appropriate Uterine Fundus: firm Incision: Healing well with no significant drainage DVT Evaluation: No evidence of DVT seen on physical exam. Labs: Lab Results  Component Value Date   WBC 8.0 06/30/2016   HGB 13.0 06/30/2016   HCT 38.6 06/30/2016   MCV 79.4 06/30/2016   PLT 222  06/30/2016   CMP Latest Ref Rng & Units 06/23/2013  Glucose 70 - 99 mg/dL 78  BUN 6 - 23 mg/dL 5(L)  Creatinine 1.300.50 - 1.10 mg/dL 8.650.63  Sodium 784137 - 696147 mEq/L 135(L)  Potassium 3.7 - 5.3 mEq/L 4.1  Chloride 96 - 112 mEq/L 101  CO2 19 - 32 mEq/L 23  Calcium 8.4 - 10.5 mg/dL 9.2  Total Protein 6.0 - 8.3 g/dL 7.1  Total Bilirubin 0.3 - 1.2 mg/dL 2.9(B0.2(L)  Alkaline Phos 39 - 117 U/L 72  AST 0 - 37 U/L 14  ALT 0 - 35 U/L 10    Discharge instruction: per After Visit Summary and "Baby and Me Booklet".  After Visit Meds:  Allergies as of 07/02/2016   No Known Allergies     Medication List    STOP taking these medications   acetaminophen 500 MG tablet Commonly known as:  TYLENOL     TAKE these medications   COMFORT FIT MATERNITY SUPP LG Misc 1 application by  Does not apply route daily.   CONCEPT OB 130-92.4-1 MG Caps Take 1 tablet by mouth daily.   ibuprofen 600 MG tablet Commonly known as:  ADVIL,MOTRIN Take 1 tablet (600 mg total) by mouth every 6 (six) hours.   oxyCODONE 5 MG immediate release tablet Commonly known as:  Oxy IR/ROXICODONE Take 1 tablet (5 mg total) by mouth every 4 (four) hours as needed for breakthrough pain.   ranitidine 150 MG tablet Commonly known as:  ZANTAC Take 1 tablet (150 mg total) by mouth at bedtime.   sertraline 50 MG tablet Commonly known as:  ZOLOFT Take 1 tablet (50 mg total) by mouth daily.       Diet: routine diet  Activity: Advance as tolerated. Pelvic rest for 6 weeks.   Outpatient follow up:6 weeks Follow up Appt:No future appointments. Follow up visit: No Follow-up on file.  Postpartum contraception: Tubal Ligation  Newborn Data: Live born female  Birth Weight: 7 lb 6.9 oz (3370 g) APGAR: ,   Baby Feeding: Breast Disposition:home with mother   07/02/2016 Amber Newton, CNM

## 2016-07-02 NOTE — Lactation Note (Addendum)
This note was copied from a baby's chart. Lactation Consultation Note  Patient Name: Girl Hurshel PartyKelly Los Today's Date: 07/02/2016  per mom had baby had recently been fed 30 ml of her milk.  Mom had pumped off over 108 ml due to being over full.  LC assessed both breast with mom's permission and noted the  Right breast and left breast to be full with nodules on the right outer aspect  Of the breast and the areola to be slightly compressible. Left areola more compressible  But full . LC had mom ease back in bed and used the reverse pressure massage back towards the chest wall, and was able to hand express of fullness and the breast softened up .  LC showed mom how compressible the areola should be before latching to obtain depth.  Baby sound asleep presently and mom plans to eat breakfast , then ice bilaterally and either prepare to feed the baby or pump.  Sore nipple and engorgement prevention and tx reviewed.  [facility] RN S. Noble is aware mom is engorged and she had set mom up with a DEBP at the beginning of her shift. She also is aware mom will need ice packs to decrease swelling.     Maternal Data    Feeding    [facility] Score/Interventions                      Lactation Tools Discussed/Used     Consult Status      Matilde SprangMargaret Ann Gwendolynn Merkey 07/02/2016, 2:11 PM

## 2016-07-02 NOTE — Lactation Note (Signed)
This note was copied from a baby's chart. Lactation Consultation Note  Patient Name: Amber Newton Today's Date: 07/02/2016   [facility] 2nd visit for today - per mom the ice really helped . And I fed the baby on the left . The right is still fairly full and plan to pump.  Mom is waiting for the [facility] MD to see her for baby D/C.  Per mom does well with the hand pump.  [place] reminded mom to take her DEBP kit - so if she needs to contact her [place] [facility] in [place] - she has it. Mother informed of post-discharge support and given phone number to the lactation department, including services for phone call assistance; out-patient appointments; and breastfeeding support group. List of other breastfeeding resources in the community given in the handout. Encouraged mother to call for problems or concerns related to breastfeeding.  Maternal Data    Feeding    [facility] Score/Interventions                      Lactation Tools Discussed/Used     Consult Status      [name] [name] 07/02/2016, 2:21 PM

## 2016-07-03 ENCOUNTER — Encounter (HOSPITAL_COMMUNITY): Payer: Self-pay | Admitting: Obstetrics and Gynecology

## 2016-07-03 ENCOUNTER — Encounter (HOSPITAL_COMMUNITY): Payer: Self-pay

## 2016-07-03 ENCOUNTER — Encounter: Payer: Medicaid Other | Admitting: Certified Nurse Midwife

## 2016-07-07 ENCOUNTER — Encounter: Payer: Medicaid Other | Admitting: Advanced Practice Midwife

## 2016-08-18 ENCOUNTER — Ambulatory Visit: Payer: Medicaid Other | Admitting: Family

## 2016-09-07 ENCOUNTER — Ambulatory Visit: Payer: Medicaid Other | Admitting: Obstetrics & Gynecology

## 2016-09-18 ENCOUNTER — Ambulatory Visit: Payer: Medicaid Other | Admitting: Certified Nurse Midwife

## 2016-10-05 ENCOUNTER — Ambulatory Visit (INDEPENDENT_AMBULATORY_CARE_PROVIDER_SITE_OTHER): Payer: Medicaid Other | Admitting: Obstetrics & Gynecology

## 2016-10-05 ENCOUNTER — Encounter: Payer: Self-pay | Admitting: Obstetrics & Gynecology

## 2016-10-05 DIAGNOSIS — F53 Postpartum depression: Secondary | ICD-10-CM

## 2016-10-05 DIAGNOSIS — O99345 Other mental disorders complicating the puerperium: Secondary | ICD-10-CM

## 2016-10-05 NOTE — Addendum Note (Signed)
Addended by: Mariel AloeLARK, Filemon Breton on: 10/05/2016 01:46 PM   Modules accepted: Orders

## 2016-10-05 NOTE — Progress Notes (Addendum)
Post Partum Exam  Amber Newton is a 31 y.o. [name] AA [id number] female who presents for a postpartum visit. She is 12 weeks postpartum following a spontaneous vaginal delivery. I have fully reviewed the prenatal and intrapartum course. The delivery was at 39 gestational weeks.  Anesthesia: none. Postpartum course has been unremarkable. Baby's course has been unremarkable. Baby is feeding by breast. Bleeding no bleeding. Bowel function is normal. Bladder function is normal. Patient is not sexually active. Contraception method is tubal ligation. Postpartum depression screening: positive (score 16) The following portions of the patient's history were reviewed and updated as appropriate: allergies, current medications, past family history, past medical history, past social history, past surgical history and problem list. She is having some crying spells since delivery. She denies HI, SI. She was prescribed zoloft after last pregnancy but she only took it for 2 days. She is willing to try it again. Review of Systems Pertinent items are noted in HPI.    Objective:    BP 116/78 mmHg  Pulse 78  Resp 16  Ht 5\' 5"  (1.651 m)  Wt 211 lb (95.709 kg)  BMI 35.11 kg/m2  Breastfeeding? Yes  General:  alert   Breasts:  inspection negative, no nipple discharge or bleeding, no masses or nodularity palpable  Lungs: clear to auscultation bilaterally  Heart:  regular rate and rhythm, S1, S2 normal, no murmur, click, rub or gallop  Abdomen: soft, non-tender; bowel sounds normal; no masses,  no organomegaly   Vulva:  not evaluated  Vagina: not evaluated  Cervix:  not examined  Corpus: not examined  Adnexa:  not evaluated  Rectal Exam: Not performed.        Assessment:    Normal postpartum exam. Pap smear not done at today's visit.   Plan:   1. Contraception: tubal ligation 2. She has a h/o painful heavy periods prior to BTL when not on OCPs.  So when she finishes breastfeeding,if her periods are bad again, then  she may restart OCPs. 3. Follow up in: 2 years or as needed.  4. 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

## 2016-10-10 ENCOUNTER — Institutional Professional Consult (permissible substitution): Payer: Medicaid Other

## 2016-10-11 ENCOUNTER — Telehealth: Payer: Self-pay | Admitting: Clinical

## 2016-10-11 NOTE — Telephone Encounter (Signed)
Attempt to f/u with pt after no-show appointment on 10-10-16; left HIPPA-compliant message to return call [facility] at [phone].

## 2016-10-31 ENCOUNTER — Ambulatory Visit: Payer: Medicaid Other | Admitting: Obstetrics & Gynecology

## 2017-03-09 IMAGING — US US OB FOLLOW-UP
1 series · 12 of 28 positions shown · non-contrast
Comparison: none

[Series 1: us ob follow up · 12 of 83 slices shown]
[im 4/83]
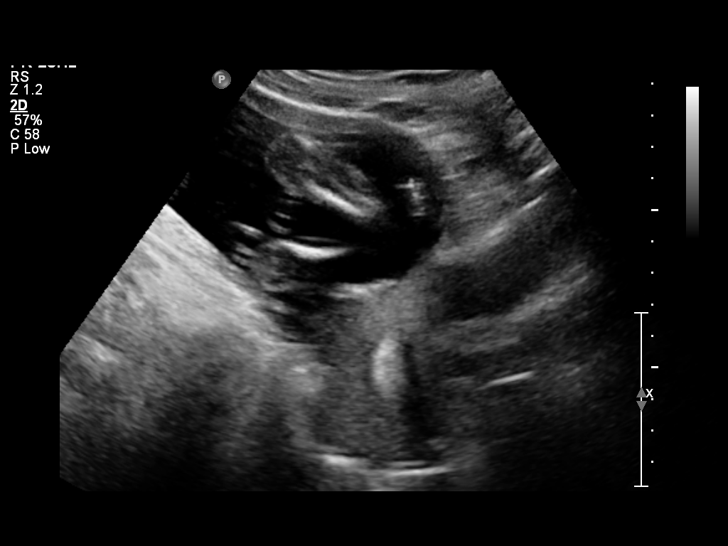
[im 10/83]
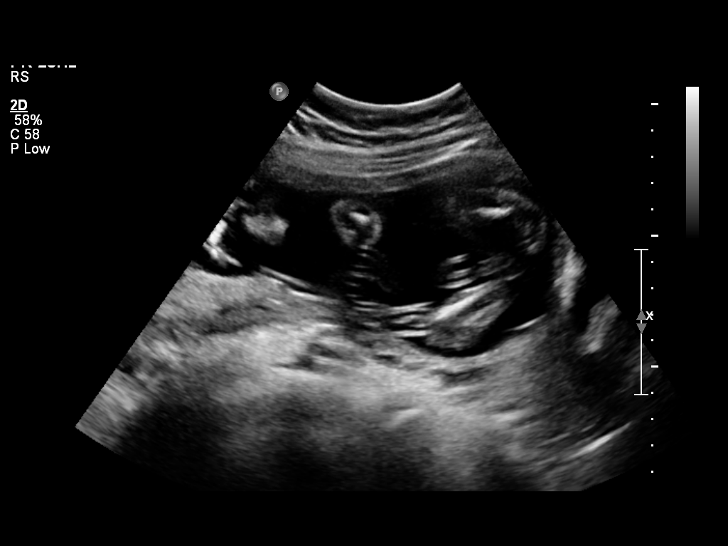
[im 16/83]
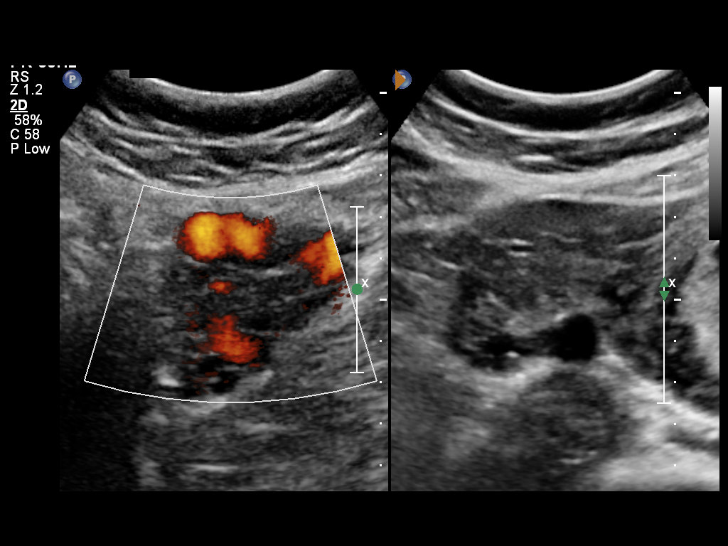
[im 25/83]
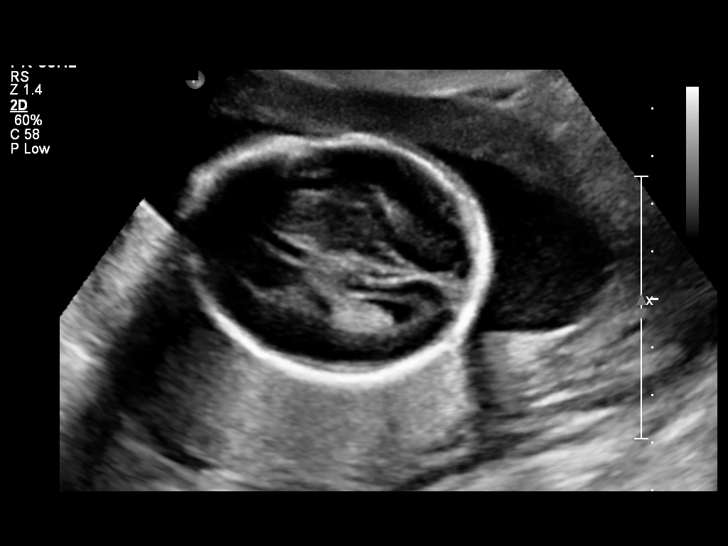
[im 31/83]
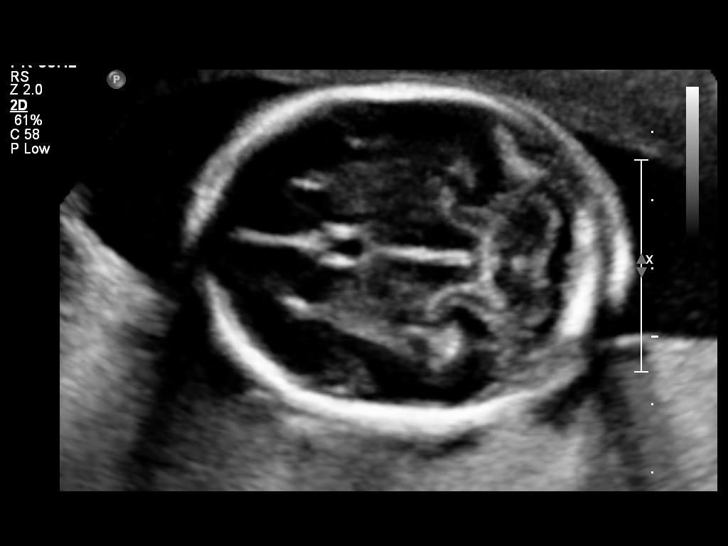
[im 37/83]
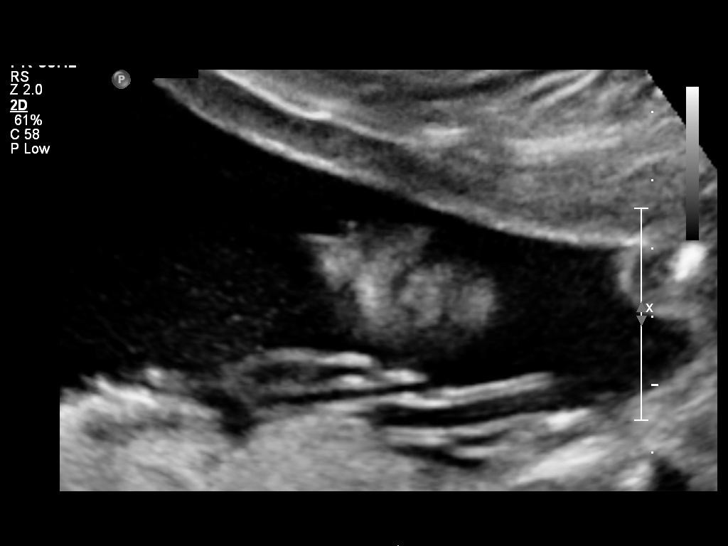
[im 46/83]
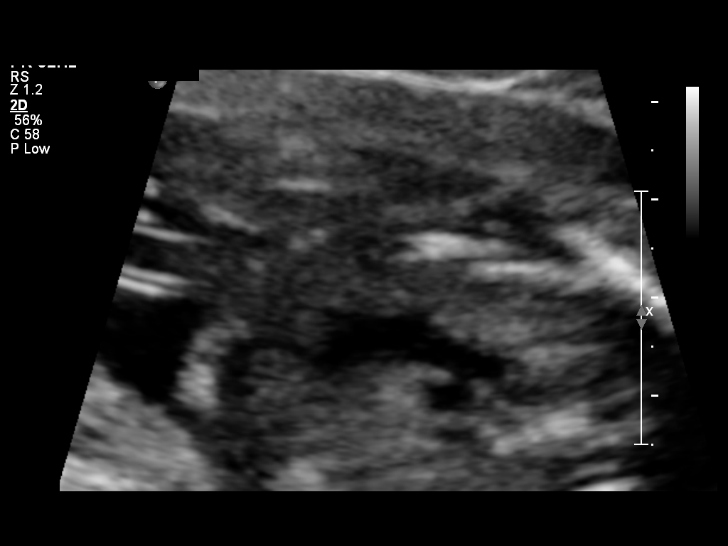
[im 52/83]
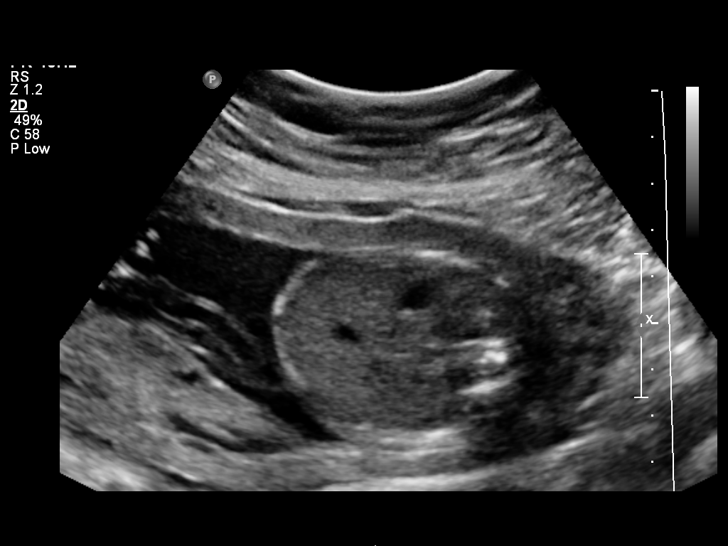
[im 58/83]
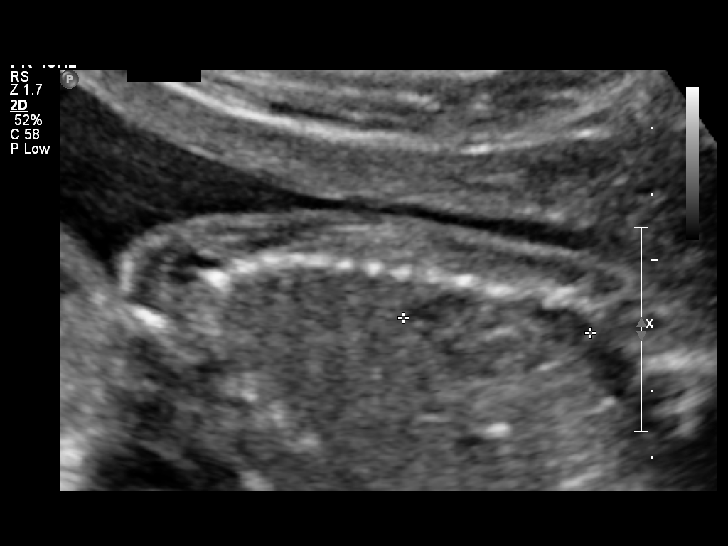
[im 67/83]
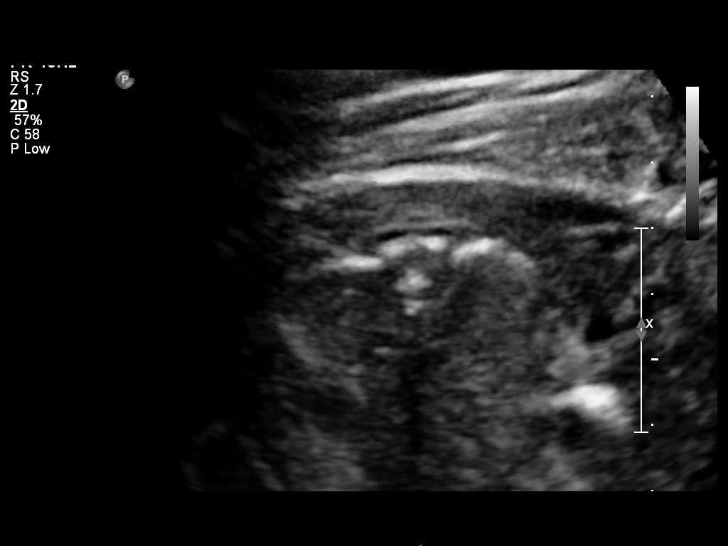
[im 73/83]
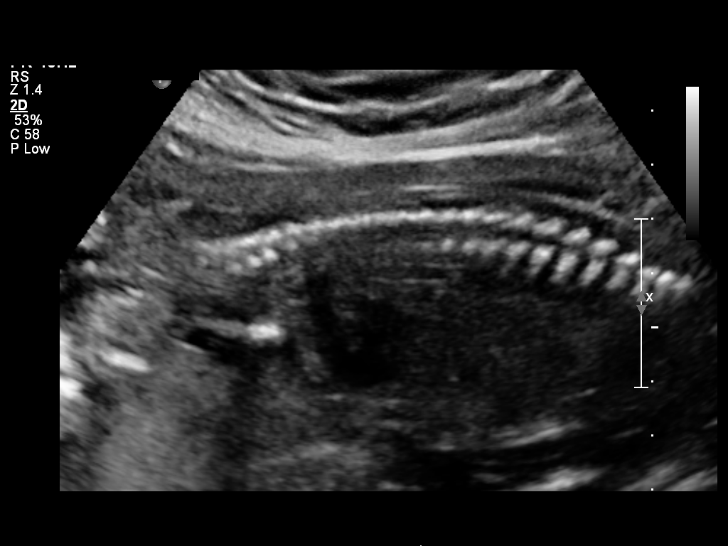
[im 79/83]
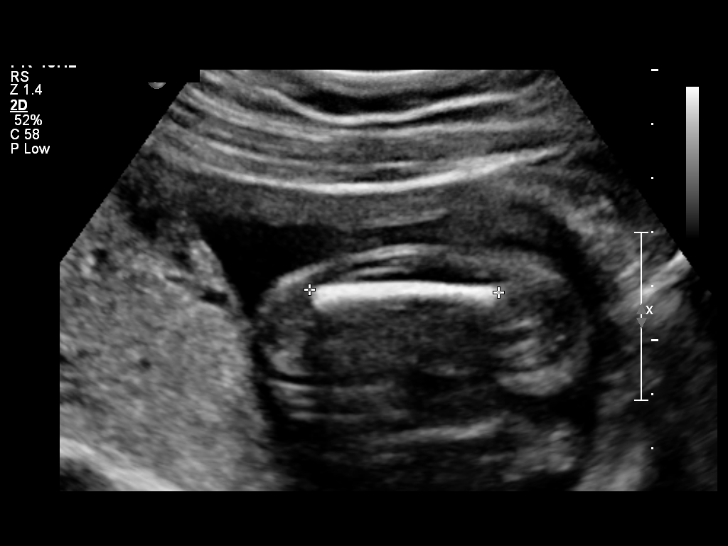

[12 of 28 positions shown; findings below may reference images not displayed]

OBSTETRICS REPORT
                      (Signed Final 07/17/2014 [DATE])

Service(s) Provided

 US OB FOLLOW UP                                       76816.1
Indications

 21 weeks gestation of pregnancy
 Follow-up incomplete fetal anatomic evaluation        Z36
 Short interval between pregnancies
Fetal Evaluation

 Num Of Fetuses:    1
 Fetal Heart Rate:  160                          bpm
 Cardiac Activity:  Observed
 Presentation:      Breech
 Placenta:          Posterior, above cervical
                    os
 P. Cord            Visualized, central
 Insertion:

 Amniotic Fluid
 AFI FV:      Subjectively within normal limits
                                             Larg Pckt:    5.36  cm
Biometry

 BPD:     47.8  mm     G. Age:  20w 3d                CI:        67.88   70 - 86
                                                      FL/HC:      19.1   18.4 -

 HC:     185.6  mm     G. Age:  20w 6d       12  %    HC/AC:      1.16   1.06 -

 AC:     159.6  mm     G. Age:  21w 0d       23  %    FL/BPD:     74.3   71 - 87
 FL:      35.5  mm     G. Age:  21w 1d       24  %    FL/AC:      22.2   20 - 24
 HUM:     34.2  mm     G. Age:  21w 5d       48  %

 Est. FW:     399  gm    0 lb 14 oz      29  %
Gestational Age

 LMP:           21w 5d        Date:  02/15/14                 EDD:   11/22/14
 U/S Today:     20w 6d                                        EDD:   11/28/14
 Best:          21w 5d     Det. By:  LMP  (02/15/14)          EDD:   11/22/14
Anatomy
 Cranium:          Appears normal         Aortic Arch:      Appears normal
 Fetal Cavum:      Appears normal         Ductal Arch:      Appears normal
 Ventricles:       Appears normal         Diaphragm:        Appears normal
 Choroid Plexus:   Appears normal         Stomach:          Appears normal, left
                                                            sided
 Cerebellum:       Appears normal         Abdomen:          Appears normal
 Posterior Fossa:  Appears normal         Abdominal Wall:   Previously seen
 Nuchal Fold:      Not applicable (>20    Cord Vessels:     Previously seen
                   wks GA)
 Face:             Appears normal         Kidneys:          Appear normal
                   (orbits and profile)
 Lips:             Appears normal         Bladder:          Appears normal
 Heart:            Appears normal         Spine:            Appears normal
                   (4CH, axis, and
                   situs)
 RVOT:             Appears normal         Lower             Previously seen
                                          Extremities:
 LVOT:             Previously seen        Upper             Previously seen
                                          Extremities:

 Other:  Fetus appears to be a male. Nasal bone visualized. Heels previously
         seen. Technically difficult due to fetal position.
Targeted Anatomy

 Fetal Central Nervous System
 Lat. Ventricles:  6.3                    Cisterna Magna:
Cervix Uterus Adnexa

 Cervical Length:    4.57     cm

 Cervix:       Normal appearance by transabdominal scan.
 Uterus:       No abnormality visualized.
 Left Ovary:    Within normal limits.
 Right Ovary:   Within normal limits.

 Adnexa:     No abnormality visualized.
Impression

 SIUP at 21+5 weeks
 Normal interval anatomy; anatomic survey complete
 Normal amniotic fluid volume
 Appropriate interval growth with EFW at the 29th %tile
Recommendations

 Follow-up as clinically indicated

 questions or concerns.

## 2017-11-22 ENCOUNTER — Ambulatory Visit: Payer: Medicaid Other | Admitting: Obstetrics & Gynecology

## 2017-11-22 ENCOUNTER — Telehealth: Payer: Self-pay | Admitting: *Deleted

## 2017-11-22 NOTE — Telephone Encounter (Signed)
Left patient a message to call and reschedule NO SHOW appointment on 11/22/17 at 1:30pm with Dr. Marice Potterove.

## 2017-12-06 ENCOUNTER — Ambulatory Visit: Payer: Medicaid Other | Admitting: Obstetrics & Gynecology

## 2017-12-11 ENCOUNTER — Ambulatory Visit: Payer: Medicaid Other | Admitting: Obstetrics & Gynecology

## 2018-01-04 ENCOUNTER — Ambulatory Visit: Payer: Medicaid Other | Admitting: Advanced Practice Midwife

## 2018-01-18 ENCOUNTER — Ambulatory Visit: Payer: Medicaid Other

## 2018-01-18 VITALS — BP 133/91 | HR 94 | Ht 68.0 in | Wt 226.0 lb

## 2018-01-18 DIAGNOSIS — Z0000 Encounter for general adult medical examination without abnormal findings: Secondary | ICD-10-CM

## 2018-01-18 DIAGNOSIS — Z3202 Encounter for pregnancy test, result negative: Secondary | ICD-10-CM

## 2018-01-18 DIAGNOSIS — E049 Nontoxic goiter, unspecified: Secondary | ICD-10-CM

## 2018-01-18 DIAGNOSIS — Z3200 Encounter for pregnancy test, result unknown: Secondary | ICD-10-CM

## 2018-01-18 LAB — THYROID PANEL WITH TSH
FREE THYROXINE INDEX: 2.4 (ref 1.4–3.8)
T3 Uptake: 31 % (ref 22–35)
T4 TOTAL: 7.7 ug/dL (ref 5.1–11.9)
TSH: 2.3 m[IU]/L

## 2018-01-18 LAB — POCT URINE PREGNANCY: PREG TEST UR: NEGATIVE

## 2018-01-18 NOTE — Progress Notes (Signed)
PT c/o "lump in her neck"  Last pap 11/23/15- normal

## 2018-01-18 NOTE — Patient Instructions (Signed)
Primary care follow up  [facility] (will see you even if you do not have sickle cell): [phone] [facility]: [phone] [facility]: [phone]

## 2018-01-18 NOTE — Progress Notes (Signed)
GYNECOLOGY ANNUAL PREVENTATIVE CARE ENCOUNTER NOTE  Subjective:   Amber Newton is a 32 y.o. [id number] female here for a routine annual gynecologic exam.  Current complaints: feeling tired and a lump in her neck. Also has intermittent nausea.   Denies abnormal vaginal bleeding, discharge, pelvic pain, problems with intercourse or other gynecologic concerns.    Gynecologic History Patient's last menstrual period was 12/30/2017. Contraception: tubal ligation Last Pap: 2017. Results were: normal with negative HPV  Obstetric History OB History  Gravida Para Term Preterm AB Living  5 4 4  0 1 4  SAB TAB Ectopic Multiple Live Births  1 0 0 0 4    # Outcome Date GA Lbr Len/2nd Weight Sex Delivery Anes PTL Lv  5 Term 06/30/16 [redacted]w[redacted]d  7 lb 6.9 oz (3.37 kg) F Vag-Spont None  LIV  4 Term 11/19/14 [redacted]w[redacted]d 10:01 / 01:00 8 lb 2.6 oz (3.702 kg) M Vag-Spont EPI  LIV  3 Term 09/23/13 [redacted]w[redacted]d 08:33 / 01:57 8 lb 8.8 oz (3.878 kg) M Vag-Spont EPI  LIV  2 Term 11/01/09 [redacted]w[redacted]d  7 lb (3.175 kg) M Vag-Spont  N LIV  1 SAB             Past Medical History:  Diagnosis Date  . Abnormal Pap smear    HPV, colpo  . Anemia   . Headache(784.0)   . Vaginal Pap smear, abnormal     Past Surgical History:  Procedure Laterality Date  . TUBAL LIGATION Bilateral 06/30/2016   Procedure: POST PARTUM TUBAL LIGATION;  Surgeon: [place] Bing, MD;  Location: [facility];  Service: Gynecology;  Laterality: Bilateral;  . WISDOM TOOTH EXTRACTION      Current Outpatient Medications on File Prior to Visit  Medication Sig Dispense Refill  . Elastic Bandages & Supports (COMFORT FIT MATERNITY SUPP LG) MISC 1 application by Does not apply route daily. (Patient not taking: Reported on 10/05/2016) 1 each 0  . ibuprofen (ADVIL,MOTRIN) 600 MG tablet Take 1 tablet (600 mg total) by mouth every 6 (six) hours. (Patient not taking: Reported on 10/05/2016) 30 tablet 0  . oxyCODONE (OXY IR/ROXICODONE) 5 MG immediate release tablet Take 1 tablet (5  mg total) by mouth every 4 (four) hours as needed for breakthrough pain. (Patient not taking: Reported on 10/05/2016) 30 tablet 0  . Prenat w/o A Vit-FeFum-FePo-FA (CONCEPT OB) 130-92.4-1 MG CAPS Take 1 tablet by mouth daily. (Patient not taking: Reported on 10/05/2016) 30 capsule 12  . ranitidine (ZANTAC) 150 MG tablet Take 1 tablet (150 mg total) by mouth at bedtime. (Patient not taking: Reported on 10/05/2016) 30 tablet 0  . sertraline (ZOLOFT) 50 MG tablet Take 1 tablet (50 mg total) by mouth daily. (Patient not taking: Reported on 04/27/2016) 30 tablet 3   No current facility-administered medications on file prior to visit.     No Known Allergies  Social History:  reports that she has never smoked. She has never used smokeless tobacco. She reports that she does not drink alcohol or use drugs.  Family History  Problem Relation Age of Onset  . Hypertension Mother   . Cancer Father   . Cancer Maternal Aunt   . Hypertension Maternal Grandmother   . Diabetes Maternal Grandmother   . Diabetes Paternal Grandmother   . Cancer Paternal Grandfather     The following portions of the patient's history were reviewed and updated as appropriate: allergies, current medications, past family history, past medical history, past social history, past surgical  history and problem list.  Review of Systems Pertinent items noted in HPI and remainder of comprehensive ROS otherwise negative.   Objective:  BP (!) 133/91   Pulse 94   Ht 5\' 8"  (1.727 m)   Wt 226 lb (102.5 kg)   LMP 12/30/2017   Breastfeeding? Yes   BMI 34.36 kg/m  CONSTITUTIONAL: Well-developed, well-nourished female in no acute distress.  HENT:  Normocephalic, atraumatic, External right and left ear normal. Oropharynx is clear and moist EYES: Conjunctivae and EOM are normal. Pupils are equal, round, and reactive to light. No scleral icterus.  NECK: Normal range of motion, supple, no masses.  Thyroid enlarged SKIN: Skin is warm and dry. No  rash noted. Not diaphoretic. No erythema. No pallor. MUSCULOSKELETAL: Normal range of motion. No tenderness.  No cyanosis, clubbing, or edema.  2+ distal pulses. NEUROLOGIC: Alert and oriented to person, place, and time. Normal reflexes, muscle tone coordination. No cranial nerve deficit noted. PSYCHIATRIC: Normal mood and affect. Normal behavior. Normal judgment and thought content. CARDIOVASCULAR: Normal heart rate noted, regular rhythm RESPIRATORY: Clear to auscultation bilaterally. Effort and breath sounds normal, no problems with respiration noted. BREASTS: Symmetric in size. No masses, skin changes, nipple drainage, or lymphadenopathy. ABDOMEN: Soft, normal bowel sounds, no distention noted.  No tenderness, rebound or guarding.  PELVIC: Normal appearing external genitalia; normal appearing vaginal mucosa and cervix.  No abnormal discharge noted.  Pap smear obtained.  Normal uterine size, no other palpable masses, no uterine or adnexal tenderness.  Assessment and Plan:  1. Well woman exam (no gynecological exam) - No complaints. Routine care.  - No pap needed this year  2. Possible pregnancy -Negative UPT - POCT urine pregnancy  3. Enlarged thyroid - Thyroid Panel With TSH - Recommended that patient follow up with PCP for management of thyroid. Resources given.  Will follow up results of pap smear and manage accordingly. Routine preventative health maintenance measures emphasized. Please refer to After Visit Summary for other counseling recommendations.   Rolm BookbinderCaroline M Naydeen Speirs, CNM 01/18/18 10:38 AM

## 2018-02-12 ENCOUNTER — Ambulatory Visit: Payer: Medicaid Other | Admitting: Certified Nurse Midwife

## 2018-02-19 ENCOUNTER — Ambulatory Visit: Payer: Medicaid Other | Admitting: Obstetrics and Gynecology

## 2019-06-10 ENCOUNTER — Encounter: Payer: Self-pay | Admitting: Advanced Practice Midwife

## 2019-06-10 ENCOUNTER — Ambulatory Visit: Payer: Medicaid Other | Admitting: Advanced Practice Midwife

## 2019-06-10 ENCOUNTER — Other Ambulatory Visit: Payer: Self-pay

## 2019-06-10 ENCOUNTER — Other Ambulatory Visit (HOSPITAL_COMMUNITY)
Admission: RE | Admit: 2019-06-10 | Discharge: 2019-06-10 | Disposition: A | Discharge status: Home / Self Care | Payer: Medicaid Other | Source: Ambulatory Visit | Attending: Advanced Practice Midwife | Admitting: Advanced Practice Midwife

## 2019-06-10 VITALS — BP 131/80 | HR 81 | Temp 98.6°F | Resp 16 | Ht 68.0 in | Wt 251.0 lb

## 2019-06-10 DIAGNOSIS — R413 Other amnesia: Secondary | ICD-10-CM

## 2019-06-10 DIAGNOSIS — Z01419 Encounter for gynecological examination (general) (routine) without abnormal findings: Secondary | ICD-10-CM | POA: Diagnosis present

## 2019-06-10 DIAGNOSIS — R5383 Other fatigue: Secondary | ICD-10-CM | POA: Insufficient documentation

## 2019-06-10 DIAGNOSIS — N939 Abnormal uterine and vaginal bleeding, unspecified: Secondary | ICD-10-CM | POA: Insufficient documentation

## 2019-06-10 DIAGNOSIS — R635 Abnormal weight gain: Secondary | ICD-10-CM

## 2019-06-10 MED ORDER — NORETHIN ACE-ETH ESTRAD-FE 1-20 MG-MCG(24) PO TABS: 1.0000 | ORAL_TABLET | Freq: Every day | ORAL | 11 refills | Status: AC

## 2019-06-10 NOTE — Progress Notes (Signed)
Subjective:     Amber Newton is a 34 y.o. [id number] female with hx BTL in 2018 here at [facility] for a routine exam.  Current complaints: Regular, heavy, painful periods. She also reports fatigue, weight gain and difficulty losing weight, temperature sensitivity and short term memory loss x 1 year.  She has PCP but has not followed up with them about symptoms.  Personal health questionnaire reviewed: yes.  Do you have a primary care provider? yes How many times per week do you exercise? 2-3 Do you feel safe at home? yes Has anyone hit, slapped, or kicked you recently? no Do you feel sad, tired, or upset most days or are you mostly happy with life? mostly happy    Gynecologic History Patient's last menstrual period was 05/22/2019. Contraception: tubal ligation Last Pap: 2018. Results were: normal Last mammogram: n/a.   Obstetric History OB History  Gravida Para Term Preterm AB Living  5 4 4  0 1 4  SAB TAB Ectopic Multiple Live Births  1 0 0 0 4    # Outcome Date GA Lbr Len/2nd Weight Sex Delivery Anes PTL Lv  5 Term 06/30/16 [redacted]w[redacted]d  7 lb 6.9 oz (3.37 kg) F Vag-Spont None  LIV  4 Term 11/19/14 [redacted]w[redacted]d 10:01 / 01:00 8 lb 2.6 oz (3.702 kg) M Vag-Spont EPI  LIV  3 Term 09/23/13 [redacted]w[redacted]d 08:33 / 01:57 8 lb 8.8 oz (3.878 kg) M Vag-Spont EPI  LIV  2 Term 11/01/09 [redacted]w[redacted]d  7 lb (3.175 kg) M Vag-Spont  N LIV  1 SAB              The following portions of the patient's history were reviewed and updated as appropriate: allergies, current medications, past family history, past medical history, past social history, past surgical history and problem list.  Review of Systems Pertinent items noted in HPI and remainder of comprehensive ROS otherwise negative.    Objective:   BP 131/80   Pulse 81   Temp 98.6 F (37 C)   Resp 16   Ht 5\' 8"  (1.727 m)   Wt 251 lb (113.9 kg)   LMP 05/22/2019   Breastfeeding No   BMI 38.16 kg/m   VS reviewed, nursing note reviewed,  Constitutional: well  developed, well nourished, no distress HEENT: normocephalic CV: normal rate Pulm/chest wall: normal effort Breast Exam:  right breast normal without mass, skin or nipple changes or axillary nodes, left breast normal without mass, skin or nipple changes or axillary nodes Abdomen: soft Neuro: alert and oriented x 3 Skin: warm, dry Psych: affect normal Pelvic exam: Cervix pink, visually closed, without lesion, scant white creamy discharge, vaginal walls and external genitalia normal Bimanual exam: Cervix 0/long/high, firm, anterior, neg CMT, uterus nontender, nonenlarged, adnexa without tenderness, enlargement, or mass     Assessment/Plan:     1. Well woman exam with routine gynecological exam --Heavy regular periods, worsening since her last child 2018 --BTL for contraception --Last pap wnl 2017 - Cytology - PAP( [facility])  2. Other fatigue  - CBC - TSH - T4, free - T3, free  3. Weight gain  - TSH - T4, free - T3, free  4. Abnormal uterine bleeding (AUB) --Treat with short course of OCPs to stop bleeding, f/u in 3 months - Norethindrone Acetate-Ethinyl Estrad-FE (LOESTRIN 24 FE) 1-20 MG-MCG(24) tablet; Take 1 tablet by mouth daily.  Dispense: 1 Package; Refill: 11  5. Memory loss, short term --Symptoms x 1 year, worsening --CBC, thyroid  labs today --F/U with PCP   Follow up in: 1 year or as needed.   Fatima Blank, CNM 2:16 PM

## 2019-06-11 LAB — CBC
HCT: 39.2 % (ref 35.0–45.0)
Hemoglobin: 12.8 g/dL (ref 11.7–15.5)
MCH: 26.7 pg — ABNORMAL LOW (ref 27.0–33.0)
MCHC: 32.7 g/dL (ref 32.0–36.0)
MCV: 81.8 fL (ref 80.0–100.0)
MPV: 11.2 fL (ref 7.5–12.5)
Platelets: 295 10*3/uL (ref 140–400)
RBC: 4.79 10*6/uL (ref 3.80–5.10)
RDW: 13 % (ref 11.0–15.0)
WBC: 6.7 10*3/uL (ref 3.8–10.8)

## 2019-06-11 LAB — TSH: TSH: 1.74 mIU/L

## 2019-06-11 LAB — T3, FREE: T3, Free: 3 pg/mL (ref 2.3–4.2)

## 2019-06-11 LAB — T4, FREE: Free T4: 1.1 ng/dL (ref 0.8–1.8)

## 2019-06-12 LAB — CYTOLOGY - PAP
Chlamydia: NEGATIVE
Comment: NEGATIVE
Comment: NEGATIVE
Comment: NORMAL
Diagnosis: NEGATIVE
High risk HPV: NEGATIVE
Neisseria Gonorrhea: NEGATIVE

## 2019-07-23 ENCOUNTER — Telehealth: Payer: Self-pay | Admitting: *Deleted

## 2019-07-23 NOTE — Telephone Encounter (Signed)
Left patient a message to call and schedule 3 month F/U appointment with Misty Stanley around 09/07/2019.

## 2019-08-21 ENCOUNTER — Telehealth: Payer: Self-pay | Admitting: *Deleted

## 2019-08-21 NOTE — Telephone Encounter (Signed)
Left patient a message to call and schedule appointment.
